# Patient Record
Sex: Male | Born: 1965 | Race: Black or African American | Hispanic: No | Smoking: Never smoker
Health system: Southern US, Community
[De-identification: ages and names within clinical notes are randomized; demographics above are authoritative.]

---
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
## 2009-04-05 ENCOUNTER — Ambulatory Visit: Payer: Self-pay | Admitting: Family Medicine

## 2009-04-05 DIAGNOSIS — A630 Anogenital (venereal) warts: Secondary | ICD-10-CM | POA: Insufficient documentation

## 2009-04-05 DIAGNOSIS — R300 Dysuria: Secondary | ICD-10-CM

## 2009-04-05 DIAGNOSIS — R079 Chest pain, unspecified: Secondary | ICD-10-CM | POA: Insufficient documentation

## 2009-04-10 ENCOUNTER — Encounter: Payer: Self-pay | Admitting: Family Medicine

## 2009-04-10 ENCOUNTER — Ambulatory Visit: Payer: Self-pay | Admitting: Family Medicine

## 2009-04-11 ENCOUNTER — Encounter: Payer: Self-pay | Admitting: Family Medicine

## 2009-04-19 ENCOUNTER — Ambulatory Visit: Payer: Self-pay | Admitting: Family Medicine

## 2009-04-19 LAB — CONVERTED CEMR LAB
CO2: 28 meq/L (ref 19–32)
Calcium: 10 mg/dL (ref 8.4–10.5)
Chloride: 100 meq/L (ref 96–112)
Cholesterol: 214 mg/dL — ABNORMAL HIGH (ref 0–200)
Glucose, Bld: 92 mg/dL (ref 70–99)
HCT: 47.2 % (ref 39.0–52.0)
MCV: 91.5 fL (ref 78.0–100.0)
RBC: 5.16 M/uL (ref 4.22–5.81)
Sodium: 139 meq/L (ref 135–145)
Total Bilirubin: 0.7 mg/dL (ref 0.3–1.2)
Total Protein: 7.4 g/dL (ref 6.0–8.3)
Triglycerides: 99 mg/dL (ref <150)
VLDL: 20 mg/dL (ref 0–40)
WBC: 3.8 10*3/uL — ABNORMAL LOW (ref 4.0–10.5)

## 2009-05-14 ENCOUNTER — Encounter: Payer: Self-pay | Admitting: Family Medicine

## 2009-05-14 ENCOUNTER — Ambulatory Visit: Payer: Self-pay | Admitting: Family Medicine

## 2009-05-14 DIAGNOSIS — R319 Hematuria, unspecified: Secondary | ICD-10-CM | POA: Insufficient documentation

## 2009-05-14 LAB — CONVERTED CEMR LAB
Bilirubin Urine: NEGATIVE
Glucose, Urine, Semiquant: NEGATIVE
Ketones, urine, test strip: NEGATIVE
Protein, U semiquant: NEGATIVE
RBC / HPF: >20
Specific Gravity, Urine: 1.02
WBC Urine, dipstick: NEGATIVE
pH: 6.5

## 2009-05-21 ENCOUNTER — Ambulatory Visit: Payer: Self-pay | Admitting: Family Medicine

## 2010-04-01 NOTE — Assessment & Plan Note (Signed)
Summary: f/up,tcb   Vital Signs:  Patient profile:   45 year old male Weight:      210.4 pounds Pulse rate:   82 / minute BP sitting:   129 / 85  (right arm)  Vitals Entered By: Arlyss Repress CMA, (May 21, 2009 11:01 AM) CC: f/up hematuria. pt did not have CT scan done. decided not to have it. thinks, that he had passed the kidney stone. denies hematuria, dysuria or flank pain. pt here to discuss 'private' issue. Is Patient Diabetic? No Pain Assessment Patient in pain? no        Primary Care Provider:  Paula Compton MD  CC:  f/up hematuria. pt did not have CT scan done. decided not to have it. thinks, that he had passed the kidney stone. denies hematuria, and dysuria or flank pain. pt here to discuss 'private' issue.Marland Kitchen  History of Present Illness: Jeremy Good comes back today to address the perinanal condyloma.  Says the first cycle of the podophylox was intolerable.  Medication extended beyond the area of involvement, made wiping difficult.   Was seen for kidney stone recently, flank pain and gross hematuria resolved at the time of last visit.  No dysuria.   No further gross hematuria.   Current Medications (verified): 1)  Podofilox 0.5 % Soln (Podofilox) .... Sig: Apply To Wart Two Times Daily For Three Consecutive Days, Then Rest For 4 Days. Complete 4 Weeks of Treatment Disp 1 Container  Allergies (verified): No Known Drug Allergies  Physical Exam  General:  Well-developed,well-nourished,in no acute distress; alert,appropriate and cooperative throughout examination Rectal:  Lateral perianal condylomatous lesion.  Mild crusting of peripheral perianal skin.  Good sphincter tone.    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  Inable to tolerate podophyllin.  Opts for weekly to bi-weekly cryo therapy.  First cryo applied during today's visit.   Orders: Cryo (1st lesion) benign -  [facility] (17000) [facility]- Est Level  3 (16109)  Problem # 2:  HEMATURIA UNSPECIFIED (ICD-599.70)  Suspected that he passed a kidney stone (first one ever).  Did not keep CT scan appt.  WIll recheck UA for microscopic hematuria at next visit, for cryo of condylomatous lesion in 1-2 weeks.   Orders: [facility]- Est Level  3 (99213)  Complete Medication List: 1)  Podofilox 0.5 % Soln (Podofilox) .... Sig: apply to wart two times daily for three consecutive days, then rest for 4 days. complete 4 weeks of treatment disp 1 container  Patient Instructions: 1)  It was a pleasure to see you again today.  2)  I ask that you make another appointment with me in 2 weeks for another treatment.  3)  I would like to recheck your urine for the presence of microscopic blood in your urine, which may be related to the presence of kidney stones.

## 2010-04-01 NOTE — Letter (Signed)
Summary: Generic Letter  [facility]  [address]   [place], [place] [address]   Phone: [phone]  Fax: [phone]    04/11/2009  Jeremy Good [address] [place], [place]  [address]  Dear Mr. HAWKES,   It was a pleasure to see you in the office last week. I hope this letter finds you well.  I write with excellent news about your lab results. All your studies came back within the normal range.    A copy of your labs is enclosed with this letter.  Please feel free to call with any questions or concerns.     Sincerely,   Paula Compton MD  Appended Document: Generic Letter mailed.

## 2010-04-01 NOTE — Miscellaneous (Signed)
Summary: walk in  Clinical Lists Changes came in c/o rectal bleeding. denies constipation or hemmorhoids. states he does have warts in the area. played a lot of basketball this weekend. feels faint & dizzy. he thinks this is from his anxiety over seeing blood. placed in [facility] not. aware he will not be seeing pcp.Golden Circle RN  May 14, 2009 3:27 PM

## 2010-04-01 NOTE — Assessment & Plan Note (Signed)
Summary: f/u labs/bmc   Vital Signs:  Patient profile:   45 year old male Height:      70.25 inches Weight:      213 pounds BMI:     30.45 Temp:     97.3 degrees F oral Pulse rate:   85 / minute BP sitting:   130 / 90  (left arm) Cuff size:   large  Vitals Entered By: Tessie Fass CMA (April 19, 2009 3:56 PM) CC: F/U labs Is Patient Diabetic? No Pain Assessment Patient in pain? yes     Location: lower back   CC:  F/U labs.  History of Present Illness: Patient returns to discuss lab results, treatment plan for condylomatous lesion in perianal region.  No change in the perianal lesion.    Discussed results of UA and all bloodwork, including his negative HIV and RPR test.  No new exposures in the past six months to warrant repeat HIV testing.   In addition to the conylomatous lesion, also wtih a patch of skin that is itchy, on Rbuttock. Has been there a long time.   Habits & Providers  Alcohol-Tobacco-Diet     Tobacco Status: never  Current Medications (verified): 1)  Podofilox 0.5 % Soln (Podofilox) .... Sig: Apply To Wart Two Times Daily For Three Consecutive Days, Then Rest For 4 Days. Complete 4 Weeks of Treatment Disp 1 Container  Allergies (verified): No Known Drug Allergies  Physical Exam  General:  well appearing, no apparent distress Rectal:  verrucous lesion at 4 oclock in perianal area.  Anoscopy performed, no other lesions or masses noted.  R buttock skin with patch of rough, atopic-looking skin.  No erythema, no fluctuance.    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  Will try to treat with podophyllin at home, instructions discussed with patient. He agrees to this treatment plan.  Topical steroid to atopic skin on R buttock. Orders: [facility]- Est Level  3 (45409)  Problem # 2:  DYSURIA (ICD-788.1) Negative UA on last visit.  Sxs only after  intercourse.  No known history of STI, but will check urine GC/Chlamydia PCR, patient to bring sample back to lab next week.  Orders: [facility]- Est Level  3 (99213)Future Orders: Miscellaneous Lab Charge-[facility] ([id number]) ... 04/11/2010  Complete Medication List: 1)  Podofilox 0.5 % Soln (Podofilox) .... Sig: apply to wart two times daily for three consecutive days, then rest for 4 days. complete 4 weeks of treatment disp 1 container  Patient Instructions: 1)  The medication I am prescribing is called Podofilox.  Apply to the wart only, two times daily for three straight days,then rest (hold it)for four days.  Do this for a total of 4 cycles (4 weeks).  Avoid getting the medicine on surrounding skin as much as possible.  2)  For the rough patch on the right buttock, I am prescribing a topical steroid cream to use twice daily for the next two weeks. 3)  I would like see you again in about 1 month to see how the treatment has gone.  Prescriptions: PODOFILOX 0.5 % SOLN (PODOFILOX) SIG: Apply to wart two times daily for three consecutive days, then rest for 4 days. Complete 4 weeks of treatment DISP 1 container  #1 x 0   Entered and Authorized by:   Paula Compton MD   Signed by:   Paula Compton MD on 04/19/2009   Method used:  Electronically to        [facility] [phone]* (retail)       [address]       [place], [place]  [address]       Ph: [phone]       Fax: [phone]   RxID:   [id number]

## 2010-04-01 NOTE — Assessment & Plan Note (Signed)
Summary: NP,tcb   Vital Signs:  Patient profile:   45 year old male Height:      70.25 inches Weight:      211.3 pounds BMI:     30.21 Pulse rate:   72 / minute BP sitting:   127 / 80  (left arm)  Vitals Entered By: Arlyss Repress CMA, (April 05, 2009 4:10 PM) CC: new pt. rectum itching and burning sensation all his life.  Is Patient Diabetic? No Pain Assessment Patient in pain? no        CC:  new pt. rectum itching and burning sensation all his life. .  History of Present Illness: This is Jeremy Good first visit to our office. Had not had an established physician ever in his life. His goals are to keep well in order to be there forhis three daughters.   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 Had been seen for this in 1986, given reassurance and had not had it re-evaluated.  Describes it as an itch that is more bothersome at night and after intercourse. No changes in bowelhabits.  Notpainful.  Has noticed a small bump in the perianal skin fora longtime. Has never known himself to have HSV2. See ROS for furtherROS questions.   Also reports somechest pain that occursover the L pectoralis insertion alongthe humerus.  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  Not at all present duringseveral games of basketball.  He rates thepain at about a 2 on a 10-pt scale when it is at its worst.  No diaphoresis, no dyspnea, no radiatiokn of discomfort.   Finally, reports having occasional dysuria after intercourse. Notpresent anyothertime.  Girlfriend had a cystitis recently, wonders if hecould have gottena bladder infection from her. MOst of the time he does not sufferfrom dysuria.   Habits & Providers  Alcohol-Tobacco-Diet     Tobacco Status: never  Family History: No known familyhistory of cancers.  No DM or heart disease.  Paternal uncles and cousins with  hypertension.   Social History: Never a smoker. 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  Drinks one beer and one shot of liquor per day.  Occasionally may drink up to six beers in a sitting, once a month. No illicit drugs. Lives with fiance, three daughters.  Two older children (one son, one daughter) by a prior marriage.  Enjoys basketball, works out at gymSmoking Status:  never  Review of Systems       Weight fluctuates, under his control.  Denies fevers or chills, denies nausea or vomiting. Two BMs a day, soft andformed. No diarrhea.  No changes in bowel habits, no blood per rectum. No cough , no delayed bladder emptying. no nocturia. Pruritus ani as described in HPI.  Physical Exam  General:  well appearing, no apparent distress Eyes:  PERRL, EOMI. Clear conjunctivae Ears:  External ear exam shows no significant lesions or deformities.  Otoscopic examination reveals clear canals, tympanic membranes are intact bilaterally without bulging, retraction, inflammation or discharge. Hearing is grossly normal bilaterally. Mouth:  Oral mucosa and oropharynx without lesions or exudates.  Some missing teeth. Neck:  No deformities, masses, or tenderness noted. Chest Wall:  No pain to palpation. No sternal pain.  No skin lesions. Lungs:  Normal respiratory effort, chest expands symmetrically. Lungs are clear to auscultation, no crackles or wheezes. Heart:  Normal rate and  regular rhythm. S1 and S2 normal without gallop, murmur, click, rub or other extra sounds. Abdomen:  Bowel sounds positive,abdomen soft and non-tender without masses, organomegaly or hernias noted. Rectal:  4mm verrucous lesion along the perianal skin, at 7 o'clock. No apposed lesion. No fissures seen. No hemorrhoids. Genitalia:  Testes bilaterally descended without nodularity, tenderness or masses. No scrotal masses or lesions. No penis lesions or urethral discharge.   Impression & Recommendations:  Problem # 1:  CONDYLOMA ACUMINATA, ANAL  (ICD-078.11) Small condylomatous lesion identified with inspection.  WIll consider anoscopy and cryotherapy at next visit.  Also may consider patient-applied therapy such as podofilox.  To check HIV and RPR at labs.  Future Orders: HIV-[facility] ([id number]) ... 04/11/2010 RPR-[facility] [phone]) ... 04/03/2010  Problem # 2:  CHEST PAIN UNSPECIFIED (ICD-786.50) I suspect chest pain is musculoskeletal in etiology, as it is located along the pec muscle and not associated with exertion.  No radiation.  Pattern of pain with lifting, but not aerobic exercise. Reassurance.  Future Orders: Comp Met-[facility] [id number]) ... 04/10/2010 CBC-[facility] ([id number]) ... 04/03/2010  Problem # 3:  SCREENING FOR LIPOID DISORDERS (ICD-V77.91) Screeningfor hypercholesterolemia, DM, to be donewhen patient can return in fastingstate.  Future Orders: Lipid-[facility] ([id number]) ... 04/03/2010  Problem # 4:  DYSURIA (ICD-788.1) Intermittent. TO check a UA.  May want to consider GC/Clamydia PCR at next visit, which will be to address the condyloma issue. HIV and RPR are ordered wiht labs.  Future Orders: Urinalysis-[facility] (00000) ... 04/11/2010  Patient Instructions: 1)  It was a pleasure to see you in the office today.  2)  I am ordering fasting labwork, to be done after 8 hours of fasting.  3)  Also, I would like to see you back in the office in the coming 1 to 2 weeks to address the issue of anal irritation and itching that we discussed.

## 2010-04-01 NOTE — Assessment & Plan Note (Signed)
Summary: rectal bleeding/Bennett/breen   Vital Signs:  Patient profile:   45 year old male Weight:      215.4 pounds Pulse rate:   76 / minute BP sitting:   130 / 90  (right arm)  Vitals Entered By: Arlyss Repress CMA, (May 14, 2009 3:32 PM) CC: hematuria x  1 and extreme right flank pain with urination. 7/10. Is Patient Diabetic? No Pain Assessment Patient in pain? no        Primary Care Provider:  Paula Compton MD  CC:  hematuria x  1 and extreme right flank pain with urination. 7/10.Marland Kitchen  History of Present Illness: 73 YOM w/ PMHx/o recurrent R flank pain presenting w/ 1 day hx/o hematuria and dysuria. Pt reports having one episode of hematuria of "pink" urine as well as pain w/ urination. Pt statescR flank pain began after playing several games of pickup basketball 2 days prior.Pt states that this has never happened before. Pt states that urine color and dysuria have improved since episode.  Pt denies any recent infections,fevers,abd pain,  genital lesions, or hx/o hematuria in the past, or change in sexual partners. Pt does report significant family hx/o of kidney stones in mother as well as several other family members.Pt denies hx/o urinary frequency, urinary urgency, urinary incontinence.  Pt reports 2-3 year hx/o recurrent R flank pain, however pt denies any hx/o UTIs, or hx/o kidney stones.                 Allergies: No Known Drug Allergies  Physical Exam  General:  alert and well-developed.   Head:  normocephalic and atraumatic.   Eyes:  vision grossly intact.   Mouth:  good dentition, no oral lesions identified.  Neck:  supple and full ROM, no cervical LAD Lungs:  normal respiratory effort.  CTAB Heart:  RRR Abdomen:  S/NT/ND Msk:  No flank pain on palpation of back bilaterally.  Neurologic:  alert & oriented X3 and cranial nerves II-XII intact.   Skin:  turgor normal, color normal, no rashes, and no suspicious lesions.   Psych:  Oriented X3 and memory intact for recent  and remote.     Impression & Recommendations:  Problem # 1:  HEMATURIA UNSPECIFIED (ICD-599.70) Pt w/ likley new onset nephrolithiasis given presentation and strong family hx/o kidney stones. Will also obtain CT to evaluate for stone burden as well other etiologies for pt's hematuria. Plan to followup w/ PCP for results.  Orders: Urinalysis-[facility] (00000) CT with Contrast (CT w/ contrast) [facility]- Est  Level 4 (16109) CT without Contrast (CT w/o contrast)  Complete Medication List: 1)  Podofilox 0.5 % Soln (Podofilox) .... Sig: apply to wart two times daily for three consecutive days, then rest for 4 days. complete 4 weeks of treatment disp 1 container  Laboratory Results   Urine Tests  Date/Time Received: May 14, 2009 3:39 PM  Date/Time Reported: May 14, 2009 3:55 PM   Routine Urinalysis   Color: yellow Appearance: Clear Glucose: negative   (Normal Range: Negative) Bilirubin: negative   (Normal Range: Negative) Ketone: negative   (Normal Range: Negative) Spec. Gravity: 1.020   (Normal Range: 1.003-1.035) Blood: moderate   (Normal Range: Negative) pH: 6.5   (Normal Range: 5.0-8.0) Protein: negative   (Normal Range: Negative) Urobilinogen: 2.0   (Normal Range: 0-1) Nitrite: negative   (Normal Range: Negative) Leukocyte Esterace: negative   (Normal Range: Negative)  Urine Microscopic WBC/HPF: 0-3 RBC/HPF: >20 Bacteria/HPF: trace Mucous/HPF: trace Epithelial/HPF: 0-2  Comments: ...........test performed by...........Marland KitchenTerese Door, CMA

## 2010-04-01 NOTE — Miscellaneous (Signed)
Summary: DNKA cat scan  Clinical Lists Changes [facility] called to report that this pt did not keep his appt for a stone scan.Golden Circle RN  May 21, 2009 12:26 PM

## 2010-04-25 ENCOUNTER — Encounter: Payer: Self-pay | Admitting: *Deleted

## 2011-05-21 ENCOUNTER — Ambulatory Visit (INDEPENDENT_AMBULATORY_CARE_PROVIDER_SITE_OTHER): Payer: BC Managed Care – PPO | Admitting: Family Medicine

## 2011-05-21 VITALS — BP 141/90 | HR 71 | Temp 97.8°F | Ht 71.0 in | Wt 213.4 lb

## 2011-05-21 DIAGNOSIS — M25569 Pain in unspecified knee: Secondary | ICD-10-CM

## 2011-05-21 DIAGNOSIS — M25562 Pain in left knee: Secondary | ICD-10-CM

## 2011-05-21 MED ORDER — IBUPROFEN 800 MG PO TABS: 800.0000 mg | ORAL_TABLET | Freq: Three times a day (TID) | ORAL | Status: AC | PRN

## 2011-05-21 NOTE — Progress Notes (Signed)
Subjective: The patient is a 46 y.o. year old male who presents today for left knee pain/swelling.  Started playing basketball last week and since then has had some problems with knee swelling.  Injured at practice 9 days ago then re-injured it 4 days ago.  Did come down on it wrong one time during the game.  Has been using ice/elevation to some effect.  ROM limited by pain.  Did have arthroscopic surgery in 1981 on left knee.  This was for a medial meniscus injury.  Objective:  Filed Vitals:   05/21/11 0953  BP: 141/90  Pulse: 71  Temp: 97.8 F (36.6 C)   Gen: NAD Ext: Left knee has mild joint effusion with no erythema.  ROM (flexion) slightly limited due to discomfort.  Slight pain on the medial knee with varus stress.  No clicks or catches.  Assessment/Plan: Acute knee injury, 9 days ago with injury 3 days ago.  Will tx with motrin and RICE.  RTC if not better in 7-10 days  Please also see individual problems in problem list for problem-specific plans.

## 2011-05-21 NOTE — Patient Instructions (Signed)
It was great to see you today! I want you to take 800mg  of Motrin three times a day with meals for then next week.  Try to take it a little easy during that time as well to let your knee heal.

## 2012-04-25 ENCOUNTER — Ambulatory Visit (INDEPENDENT_AMBULATORY_CARE_PROVIDER_SITE_OTHER): Payer: BC Managed Care – PPO | Admitting: Family Medicine

## 2012-04-25 ENCOUNTER — Encounter: Payer: Self-pay | Admitting: Family Medicine

## 2012-04-25 ENCOUNTER — Ambulatory Visit (HOSPITAL_COMMUNITY)
Admission: RE | Admit: 2012-04-25 | Discharge: 2012-04-25 | Disposition: A | Discharge status: Home / Self Care | Payer: BC Managed Care – PPO | Source: Ambulatory Visit | Attending: Family Medicine | Admitting: Family Medicine

## 2012-04-25 ENCOUNTER — Other Ambulatory Visit (HOSPITAL_COMMUNITY)
Admission: RE | Admit: 2012-04-25 | Discharge: 2012-04-25 | Disposition: A | Discharge status: Home / Self Care | Payer: BC Managed Care – PPO | Source: Ambulatory Visit | Attending: Family Medicine | Admitting: Family Medicine

## 2012-04-25 VITALS — BP 140/94 | HR 68 | Ht 70.25 in | Wt 213.8 lb

## 2012-04-25 DIAGNOSIS — Z711 Person with feared health complaint in whom no diagnosis is made: Secondary | ICD-10-CM

## 2012-04-25 DIAGNOSIS — Z113 Encounter for screening for infections with a predominantly sexual mode of transmission: Secondary | ICD-10-CM | POA: Insufficient documentation

## 2012-04-25 DIAGNOSIS — M79642 Pain in left hand: Secondary | ICD-10-CM

## 2012-04-25 DIAGNOSIS — M940 Chondrocostal junction syndrome [Tietze]: Secondary | ICD-10-CM

## 2012-04-25 DIAGNOSIS — M79609 Pain in unspecified limb: Secondary | ICD-10-CM

## 2012-04-25 DIAGNOSIS — R300 Dysuria: Secondary | ICD-10-CM

## 2012-04-25 DIAGNOSIS — R079 Chest pain, unspecified: Secondary | ICD-10-CM

## 2012-04-25 DIAGNOSIS — R030 Elevated blood-pressure reading, without diagnosis of hypertension: Secondary | ICD-10-CM

## 2012-04-25 LAB — POCT URINALYSIS DIPSTICK
Blood, UA: NEGATIVE
Ketones, UA: NEGATIVE
Protein, UA: NEGATIVE
Spec Grav, UA: 1.015
Urobilinogen, UA: 0.2
pH, UA: 6

## 2012-04-25 NOTE — Patient Instructions (Signed)
I'll let you know about the tests.   Your EKG looked good.  Make an appointment to be seen by sports medicine for your Left arm.    It was good to meet you today!

## 2012-04-26 ENCOUNTER — Telehealth: Payer: Self-pay | Admitting: Family Medicine

## 2012-04-26 LAB — CBC
Hemoglobin: 14.2 g/dL (ref 13.0–17.0)
MCH: 28.8 pg (ref 26.0–34.0)
MCV: 85.4 fL (ref 78.0–100.0)
Platelets: 211 10*3/uL (ref 150–400)
RBC: 4.93 MIL/uL (ref 4.22–5.81)
WBC: 3.4 10*3/uL — ABNORMAL LOW (ref 4.0–10.5)

## 2012-04-26 LAB — COMPREHENSIVE METABOLIC PANEL
Albumin: 4.2 g/dL (ref 3.5–5.2)
Alkaline Phosphatase: 46 U/L (ref 39–117)
BUN: 11 mg/dL (ref 6–23)
CO2: 27 mEq/L (ref 19–32)
Glucose, Bld: 94 mg/dL (ref 70–99)
Sodium: 137 mEq/L (ref 135–145)
Total Bilirubin: 0.6 mg/dL (ref 0.3–1.2)
Total Protein: 6.9 g/dL (ref 6.0–8.3)

## 2012-04-26 LAB — LDL CHOLESTEROL, DIRECT: Direct LDL: 112 mg/dL — ABNORMAL HIGH

## 2012-04-26 NOTE — Telephone Encounter (Signed)
Pt would like a call concerning sport medicine results.

## 2012-04-26 NOTE — Telephone Encounter (Signed)
Called pt and informed of labs and also that he has appt in [place] on Fri and can discuss labs then. Pt agreed. Lorenda Hatchet, Renato Battles

## 2012-04-27 ENCOUNTER — Encounter: Payer: Self-pay | Admitting: Family Medicine

## 2012-04-27 DIAGNOSIS — Z711 Person with feared health complaint in whom no diagnosis is made: Secondary | ICD-10-CM | POA: Insufficient documentation

## 2012-04-27 DIAGNOSIS — M940 Chondrocostal junction syndrome [Tietze]: Secondary | ICD-10-CM | POA: Insufficient documentation

## 2012-04-27 DIAGNOSIS — M79642 Pain in left hand: Secondary | ICD-10-CM | POA: Insufficient documentation

## 2012-04-27 NOTE — Assessment & Plan Note (Signed)
Tried to reassure patient regarding difference between STD and UTI. Am not sure made much headway regarding this. Obtain GC Chlamydia cytology as well as urinalysis. Will call patient with results.

## 2012-04-27 NOTE — Assessment & Plan Note (Signed)
Noncardiac chest pain. Likely costochondritis. He is an active guy who works out often and I believe his suffering from costochondritis. Did obtain EKG due to patient preference. This was a beautifully normal EKG in normal sinus rhythm. Reassured patient regarding this and he was much relief. He does not like to take medications and will use over-the-counter herbs to try and treat his pain.

## 2012-04-27 NOTE — Progress Notes (Signed)
Jeremy Good is a 47 y.o. male who presents to [facility] today with complaints of several concerns:  1.  Left hand tingling:  Occasionally radiates to Left forearm.  Present for the past 6 weeks roughly. Patient describes tingling during the day and occasional numbness or pain awakened from sleep. Worse thumb index and middle finger. Also has pain at base of left thumb. No pain in left upper arm. Please see below for further details regarding this. No fall or injuries.  2. Chest pain: Also present for past month or so. Patient describes sharp shooting chest pain left side of his chest. Not brought on by exertion. Occasionally brought on by pain in his hand. He is concerned that these are signs of a heart attack with pain in the left side and pain in his left arm. He denies any diaphoresis, nausea, vomiting, shortness of breath. He is active guy and works out daily. He never has pain when working out. Occasionally has pain when pressing upon his chest.  3.  Concern for STD:  Patient states that his current fianc with whom he has been in the past 8 years had trouble with pyelonephritis about 8 years ago. She is not a trouble since then. About 2 weeks ago she had another UTI. He is concerned that he is "passing this back and forth" whenever they have sexual intercourse. He himself occasionally has dysuria. It is not consistent in nature. Denies any hematuria. Denies any history of STDs. Does endorse history of rectal condylomata. About one month ago he does have left lower quadrant pain radiating to his testicles and passage of blood. This lasted for about 3 days and then subsequently subsided. No further pain since then except for occasional dysuria. No further gross hematuria.  Patient also had concerns that his neighbor was "using herbs and roots" to cast spells on him. He believes that his neighbor has been placing magical spells upon the garbage in his yard and that this causes the tingling in his arm whenever  he picks up the trash. He is also afraid these spells are what is causing the pain in his chest. He does endorse a strong religious background as well as belief in witchcraft.  The following portions of the patient's history were reviewed and updated as appropriate: allergies, current medications, past medical history, family and social history, and problem list.  Patient is a nonsmoker.    No past medical history on file. No past surgical history on file.  Medications reviewed. No current outpatient prescriptions on file.   No current facility-administered medications for this visit.    ROS as above otherwise neg.  No chest pain, palpitations, SOB, Fever, Chills, Abd pain, N/V/D.  Physical Exam:  BP 140/94  Pulse 68  Ht 5' 10.25" (1.784 m)  Wt 213 lb 12.8 oz (96.979 kg)  BMI 30.47 kg/m2 Gen:  Alert, cooperative patient who appears stated age in no acute distress.  Vital signs reviewed. HEENT: EOMI,  MMM Pulm:  Clear to auscultation bilaterally with good air movement.  No wheezes or rales noted.   Cardiac:  Regular rate and rhythm without murmur auscultated.  Good S1/S2. Chest: Some tenderness to palpation left-sided chest along left sternal border. Abd:  Soft/nondistended/nontender.  Good bowel sounds throughout all four quadrants.  No masses noted.  GU: Patient deferred genital or rectal exam. Exts: Non edematous BL  LE, warm and well perfused.  Psych: Conversant, pleasant, linear and coherent thought process. Not depressed or anxious appearing.  Results for orders placed in visit on 04/25/12 (from the past 72 hour(s))  POCT URINALYSIS DIPSTICK     Status: None   Collection Time    04/25/12 11:35 AM      Result Value Range   Color, UA YELLOW     Clarity, UA CLEAR     Glucose, UA NEG     Bilirubin, UA NEG     Ketones, UA NEG     Spec Grav, UA 1.015     Blood, UA NEG     pH, UA 6.0     Protein, UA NEG     Urobilinogen, UA 0.2     Nitrite, UA NEG     Leukocytes, UA  Negative    COMPREHENSIVE METABOLIC PANEL     Status: None   Collection Time    04/25/12 12:12 PM      Result Value Range   Sodium 137  135 - 145 mEq/L   Potassium 4.4  3.5 - 5.3 mEq/L   Chloride 102  96 - 112 mEq/L   CO2 27  19 - 32 mEq/L   Glucose, Bld 94  70 - 99 mg/dL   BUN 11  6 - 23 mg/dL   Creat 9.14  7.82 - 9.56 mg/dL   Total Bilirubin 0.6  0.3 - 1.2 mg/dL   Alkaline Phosphatase 46  39 - 117 U/L   AST 16  0 - 37 U/L   ALT 11  0 - 53 U/L   Total Protein 6.9  6.0 - 8.3 g/dL   Albumin 4.2  3.5 - 5.2 g/dL   Calcium 9.9  8.4 - 21.3 mg/dL  CBC     Status: Abnormal   Collection Time    04/25/12 12:12 PM      Result Value Range   WBC 3.4 (*) 4.0 - 10.5 K/uL   RBC 4.93  4.22 - 5.81 MIL/uL   Hemoglobin 14.2  13.0 - 17.0 g/dL   HCT 08.6  57.8 - 46.9 %   MCV 85.4  78.0 - 100.0 fL   MCH 28.8  26.0 - 34.0 pg   MCHC 33.7  30.0 - 36.0 g/dL   RDW 62.9  52.8 - 41.3 %   Platelets 211  150 - 400 K/uL  LDL CHOLESTEROL, DIRECT     Status: Abnormal   Collection Time    04/25/12 12:12 PM      Result Value Range   Direct LDL 112 (*)    Comment: ATP III Classification (LDL):           < 100        mg/dL         Optimal          100 - 129     mg/dL         Near or Above Optimal          130 - 159     mg/dL         Borderline High          160 - 189     mg/dL         High           > 190        mg/dL         Very High

## 2012-04-27 NOTE — Assessment & Plan Note (Signed)
Question of de Quervain's tenosynovitis versus carpal tunnel syndrome. He has symptoms of both of these as well as symptoms of tingling along his median and radial nerves. As these symptoms arise close to his hand rather than anywhere else along his arm, I do not think that he is having trouble with peripheral nerve symptoms located more approximately. Recommended he be seen at [facility] for possible ultrasound and further delineation of his numbness, tingling, pain.

## 2012-04-29 ENCOUNTER — Encounter: Payer: Self-pay | Admitting: Family Medicine

## 2012-04-29 ENCOUNTER — Ambulatory Visit (INDEPENDENT_AMBULATORY_CARE_PROVIDER_SITE_OTHER): Payer: BC Managed Care – PPO | Admitting: Family Medicine

## 2012-04-29 VITALS — BP 143/95 | HR 67 | Ht 70.25 in | Wt 214.0 lb

## 2012-04-29 VITALS — BP 148/92 | HR 81 | Ht 70.25 in | Wt 214.0 lb

## 2012-04-29 DIAGNOSIS — M79609 Pain in unspecified limb: Secondary | ICD-10-CM

## 2012-04-29 DIAGNOSIS — R300 Dysuria: Secondary | ICD-10-CM

## 2012-04-29 DIAGNOSIS — M79642 Pain in left hand: Secondary | ICD-10-CM

## 2012-04-29 NOTE — Assessment & Plan Note (Signed)
Complaint of intermittent dysuria that follows intercourse and first-void in the morning.  No penile discharge.  Reviewed the results of his UA at last visit (pristine) and PCR for GC/CT, which were negative.  Normal renal function on labs.  Discussed other causes other than infectious, such as chemical urethritis, which he believes is not likely.  I have offered to collect/order a urine culture despite the negative UA on last visit; he states he cannot produce a urine specimen today and will defer till next visit.  I have also asked him if he could get some information about the nature of his fiance's recent infection and the antibiotic she was treated with.  He will make a follow up appointment with me for next week to review/discuss.
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
## 2012-04-29 NOTE — Patient Instructions (Addendum)
I think you are suffering from carpal tunnel syndrome. We will start you wearing the brace at night. Let me see you back in 2-3 weeks. I would expect you to still have some numbness then, but hopefully we will be improving.CALL us if it gets suddenly worse or you have questions.

## 2012-04-29 NOTE — Patient Instructions (Addendum)
It was a pleasure to see you today.   It will be very important to see you back with the history of your fiance's recent infection, and to get a urine culture.

## 2012-05-02 ENCOUNTER — Encounter: Payer: Self-pay | Admitting: Family Medicine

## 2012-05-02 NOTE — Progress Notes (Signed)
  Subjective:    Patient ID: Jeremy Good, male    DOB: [birth year], 47 y.o.   MRN: [id number]  HPI 2 weeks of left hand numbness and pain that occurs primarily at night. He has some numbness noted when he falls asleep but easily awakens at 2 or 3 in the morning extreme pain and burning in the left hand. During the day he notes that his hand feels weak particularly in the fingers. He has some difficulty gripping the steering when he drives. He is right-hand dominant. It never had issue with this before. He does lift weights occasionally dark also specific injury.. right hand is asymptomatic. PERTINENT  PMH / PSH: No prior history of left hand or wrist injury and no surgeries.   Review of Systems Denies any color change in the skin of the left hand. Notes no difficulty with cold or heat sensation. No rash. Denies fever, sweats, chills.    Objective:   Physical Exam  Vital signs are reviewed GENERAL: Well-developed male no acute distress AND skull and bilaterally symmetrical. The skin is without any sign of rash or discoloration. The left hand reveals no ecchymoses. There is no thenar or hyperthenar atrophy. The abductor and adduct her portion of the intrinsic muscles is intact. Grip strength is 2+ bilaterally symmetrical. Sensation to soft touch is intact as is 2. discrimination. Positive Tinel on the left. Positive Phalen and 35 seconds.      Assessment & Plan:  #1. Carpal tunnel syndrome. Unclear etiology especially given this is his nondominant hand. We will treat conservatively a cockup splint for 2 weeks and then have him return to clinic. If no improvement I will consider ultrasound plus minus corticosteroid injection at that time.

## 2012-05-03 ENCOUNTER — Ambulatory Visit: Payer: BC Managed Care – PPO | Admitting: Family Medicine

## 2012-05-23 ENCOUNTER — Ambulatory Visit: Payer: BC Managed Care – PPO | Admitting: Family Medicine

## 2013-09-11 ENCOUNTER — Encounter: Payer: Self-pay | Admitting: *Deleted

## 2013-09-11 NOTE — Progress Notes (Signed)
   Pt [facility] around 1 PM today with complaint of dizziness, shortness of breath after activity and feeling faint.  Pt denied chest pain.  Pt also stated he has been coughing up dark yellow mucous and having ear drainage x 3 weeks.  Pt has tried mucinex  with some relief.  Precepted with Dr. Jennette KettleNeal; tried to find an appt slop [facility] at [facility], but no appt available, pt informed.  Pt told he should go to urgent care.  Pt stated he does not want to go to urgent care and opt for an appt tomorrow 09/12/2013.  Pt advised if symptoms worsen he should go to urgent care or emergency room.  Appt 09/12/2013 at 3 PM.  Clovis PuMartin, Tamika L, RN

## 2013-09-12 ENCOUNTER — Ambulatory Visit: Payer: BC Managed Care – PPO | Admitting: Family Medicine

## 2013-09-13 ENCOUNTER — Ambulatory Visit: Payer: BC Managed Care – PPO | Admitting: Family Medicine

## 2013-09-14 ENCOUNTER — Ambulatory Visit (INDEPENDENT_AMBULATORY_CARE_PROVIDER_SITE_OTHER): Payer: BC Managed Care – PPO | Admitting: Family Medicine

## 2013-09-14 ENCOUNTER — Encounter: Payer: Self-pay | Admitting: Family Medicine

## 2013-09-14 VITALS — BP 121/76 | HR 78 | Temp 97.9°F | Ht 70.25 in | Wt 210.3 lb

## 2013-09-14 DIAGNOSIS — R0989 Other specified symptoms and signs involving the circulatory and respiratory systems: Secondary | ICD-10-CM | POA: Insufficient documentation

## 2013-09-14 DIAGNOSIS — R42 Dizziness and giddiness: Secondary | ICD-10-CM | POA: Insufficient documentation

## 2013-09-14 NOTE — Assessment & Plan Note (Signed)
No red flags, most likely multifactorial.  Recommend lots of fluids, mask for work, no more inner ear drops and f/u in 3-4 weeks.  If no improvement then evaluate for BPPV or labyrinthitis.

## 2013-09-14 NOTE — Patient Instructions (Signed)
[name], it was nice seeing you today.  Please stop using the drops in your ears and start using the mucinex DM two times per day.  We will see you back in 3-4 weeks.  Thanks, Dr. Paulina FusiHess

## 2013-09-14 NOTE — Progress Notes (Signed)
Jeremy Good is a 48 y.o. male who presents today for f/u of pre-syncope and chest congestion.  Chest Congestion - Has been ongoing now for about 1 month with nasal congestion, productive cough with yellow sputum, HA and has taken mucinex with some relief.  Denies any CP, blurred vision.  Has had some dizziness, especially with activity over the past week-two weeks   Dizziness - Has been ongoing now for multiple months, has been increasing over the past 2 weeks, when he was [place].  Was out in the heat when he noticed he was slightly dizzy but denies any vertigo or hearing loss.  He does endorse some tinnitus b/l but only happens with dizziness and denies any hearing loss fever chills sweats or recent swimming type activities/otalgia.  He does use hydrogen peroxide in his ears over the last two weeks to try and help.  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  His dizzy spells are fewer than 3 per day and he does not have any syncope, pre-syncope, or palpitations.  No FHx of SCD.     No past medical history on file.  History  Smoking status  . Never Smoker   Smokeless tobacco  . Not on file    No family history on file.  No current outpatient prescriptions on file prior to visit.   No current facility-administered medications on file prior to visit.    ROS: Per HPI.  All other systems reviewed and are negative.   Physical Exam Filed Vitals:   09/14/13 0909  BP: 121/76  Pulse: 78  Temp: 97.9 F (36.6 C)    Physical Examination: General appearance - alert, well appearing, and in no distress Mental status - alert, oriented to person, place, and time Eyes - pupils equal and reactive, extraocular eye movements intact Ears - bilateral TM's and external ear canals normal Nose - normal and patent, no erythema, discharge or polyps Chest - clear to auscultation, no wheezes, rales or rhonchi, symmetric air  entry Heart - normal rate, regular rhythm, normal S1, S2, no murmurs, rubs, clicks or gallops    Chemistry      Component Value Date/Time   NA 137 04/25/2012 1212   K 4.4 04/25/2012 1212   CL 102 04/25/2012 1212   CO2 27 04/25/2012 1212   BUN 11 04/25/2012 1212   CREATININE 1.07 04/25/2012 1212   CREATININE 1.16 04/10/2009 1813      Component Value Date/Time   CALCIUM 9.9 04/25/2012 1212   ALKPHOS 46 04/25/2012 1212   AST 16 04/25/2012 1212   ALT 11 04/25/2012 1212   BILITOT 0.6 04/25/2012 1212

## 2013-09-14 NOTE — Assessment & Plan Note (Signed)
Continue with Mucinex, switch to DM BID.  F/U in 3-4 weeks if no improvement.

## 2013-10-10 ENCOUNTER — Ambulatory Visit: Payer: BC Managed Care – PPO | Admitting: Family Medicine

## 2013-10-17 ENCOUNTER — Encounter: Payer: Self-pay | Admitting: Family Medicine

## 2013-10-17 ENCOUNTER — Ambulatory Visit (INDEPENDENT_AMBULATORY_CARE_PROVIDER_SITE_OTHER): Payer: BC Managed Care – PPO | Admitting: Family Medicine

## 2013-10-17 VITALS — BP 120/70 | HR 81 | Temp 98.2°F | Ht 70.0 in | Wt 209.5 lb

## 2013-10-17 DIAGNOSIS — R0989 Other specified symptoms and signs involving the circulatory and respiratory systems: Secondary | ICD-10-CM | POA: Diagnosis not present

## 2013-10-17 DIAGNOSIS — R42 Dizziness and giddiness: Secondary | ICD-10-CM | POA: Diagnosis not present

## 2013-10-17 DIAGNOSIS — E669 Obesity, unspecified: Secondary | ICD-10-CM | POA: Insufficient documentation

## 2013-10-17 DIAGNOSIS — Z833 Family history of diabetes mellitus: Secondary | ICD-10-CM | POA: Diagnosis not present

## 2013-10-17 NOTE — Patient Instructions (Signed)
It was a pleasure to see you today; I am glad you are feeling better from the respiratory illness standpoint.   As we discussed, I am asking for your signature in order for me to receive the report of the MRI you had ordered through the Urgent Care ([facility]).  I am ordering fasting labs, to be done here at your convenience.   I recommend colorectal cancer screening at age 48.   PATIENT TO SIGN ROI FORM FOR RECORDS/MRI REPORT DONE AT OUTSIDE URGENT CARE RECENTLY.  PATIENT FOR FASTING LABS HERE AT HIS CONVENIENCE.  PLEASE GIVE PATIENT A COPY OF THE CONTACT INFORMATION FOR COLON CANCER SCREENING.

## 2013-10-18 NOTE — Progress Notes (Signed)
   Subjective:    Patient ID: Jeremy Good, male    DOB: 1965-08-03, 48 y.o.   MRN: [id number]  HPI Patient here for follow up of recent upper respiratory illness; had [facility] with cough, dizziness and sensation "like I was going to pass out" while driving to the beach.  These symptoms alarmed him.  He did not have episode of LOC, but did feel dizzy. He sought care at a local urgent care and had an MRI ordered for his symptoms; this MRI was done at [facility].  He did not receive the results of the MRI.  He has a history of HSV2 for many years, wonders if this may have contributed to his illness.  At the present time he reports that he is almost back to 100% and is feeling well.  Denies fevers/chills, cough, shortness of breath, nausea/vomiting, dizziness, presyncope, chest pain, dysuria, penile discharge, active HSV2 lesions, or diarrhea/constipation, no blood per rectum.  He continues to exercise and is serious about maintaining his health.  Reports that he has been impressed with the impact of a product he gets at the health food store ("Para Response"), which contains black walnut hull and other natural supplements.  Continues to take this; has taken [employer] before, but states it is too strong with the Para Response.   He is a non-smoker. Describes his alcohol intake as reduced from a quart/week of liquor to a pint/week, as well as occasional beer.  No other drugs.  Does exercise regularly.  Is in a monogamous relationship with a woman of many years.   Family Hx; No colorectal cancer, no prostate cancer; there is significant DM history (uncles/aunts); father had alcoholism.   Patient is turning 48 later this month, discussion about recommended screening (CRC) and primary prevention (pneumococcal vaccine, influenza vaccine).  Patient admits he is "against vaccines", and has regularly refused flu vaccines.  Extended discussion about how vaccines work and risks/benefits.  Review of  Systems See above    Face-to-face encounter with patient was in excess of 45 minutes in counseling and discussion.   Objective:   Physical Exam Well appearing, no apparent distress, perhaps mildly pressured speech. HEENT Neck supple, no cervical adenopathy. EOMI. Clear oropharynx. TMs clear bilaterally. No tenderness along pinnae tragus.  No frontal or maxillary sinus tenderness.  COR regular S1S2, no extra sounds PULM Clear bilaterally, no rales or wheezes.  ABD Soft, nontender.  NEURO Gait unremarkable, no assistance.        Assessment & Plan:

## 2013-10-18 NOTE — Assessment & Plan Note (Signed)
Resolved; likely viral URI, which is self-limiting.  Discussed healthy eating, exercise, as well as routine vaccinations that may prevent patient from contracting some respiratory illnesses (pneumococcal pneumonia, influenza).  He remains skeptical.  Will be available to answer his questions as needed.

## 2013-10-18 NOTE — Assessment & Plan Note (Signed)
BMI of 30 today; family hx of DM.  Patient for screening for DM, to be done as part of fasting labs.  Also discussed other screening measures and provided him with a copy of contacts where he may make his own appointment for colorectal cancer screening.  This was recommedned to patient on verge of turning 48 years of age.  He is in agreement with this recommendation.

## 2013-10-18 NOTE — Assessment & Plan Note (Signed)
Resolved

## 2013-10-19 ENCOUNTER — Telehealth: Payer: Self-pay | Admitting: Family Medicine

## 2013-10-19 NOTE — Telephone Encounter (Signed)
I called patient to follow up on CT abd/pelvis done at [facility], for which patient left a CD-ROM of images at his last visit this week. He signed a ROI form to request the formal reading from radiologist. I informed him that I will mail the CD-ROM back to him via [employer], he is in agreement with this plan.  I will contact him when his fasting labs are done in our system (ordered as 'Future Orders').  Paula ComptonJames Dorr Perrot, MD

## 2013-10-26 ENCOUNTER — Other Ambulatory Visit: Payer: BC Managed Care – PPO

## 2013-11-03 ENCOUNTER — Ambulatory Visit: Payer: BC Managed Care – PPO | Admitting: Family Medicine

## 2013-12-04 ENCOUNTER — Ambulatory Visit (INDEPENDENT_AMBULATORY_CARE_PROVIDER_SITE_OTHER): Payer: BC Managed Care – PPO | Admitting: Family Medicine

## 2013-12-04 ENCOUNTER — Encounter: Payer: Self-pay | Admitting: Family Medicine

## 2013-12-04 VITALS — BP 141/93 | HR 80 | Temp 98.6°F | Ht 70.0 in | Wt 209.0 lb

## 2013-12-04 DIAGNOSIS — M62830 Muscle spasm of back: Secondary | ICD-10-CM

## 2013-12-04 DIAGNOSIS — M545 Low back pain, unspecified: Secondary | ICD-10-CM

## 2013-12-04 LAB — POCT URINALYSIS DIPSTICK
BILIRUBIN UA: NEGATIVE
Blood, UA: NEGATIVE
GLUCOSE UA: NEGATIVE
KETONES UA: NEGATIVE
Leukocytes, UA: NEGATIVE
Nitrite, UA: NEGATIVE
Protein, UA: NEGATIVE
SPEC GRAV UA: 1.02
Urobilinogen, UA: 0.2
pH, UA: 5.5

## 2013-12-04 MED ORDER — CYCLOBENZAPRINE HCL 5 MG PO TABS: 5.0000 mg | ORAL_TABLET | Freq: Three times a day (TID) | ORAL | Status: DC | PRN

## 2013-12-04 NOTE — Progress Notes (Signed)
Patient ID: Jeremy Good, male   DOB: [birth year], 48 y.o.   MRN: [id number]   Subjective:  Jeremy Good is a 48 y.o. male here for back pain.  Reports 2-3 weeks of low back pain L > R which began after feeling a pulling sensation when cleaning his car out. Pain is intermittent, helped somewhat by ibuprofen and not getting worse or better. He has been drinking a lot of cranberry juice and water in case it was a kidney stone or kidney infection. He has no history of either and denies dysuria, change in urinary habits, abd pain, fever. Denies any current bowel/bladder problems, unintentional weight loss, night time awakenings secondary to pain, and weakness in one or both legs.  All other pertinent systems reviewed and are negative. Objective:  BP 141/93  Pulse 80  Temp(Src) 98.6 F (37 C) (Oral)  Ht 5\' 10"  (1.778 m)  Wt 209 lb (94.802 kg)  BMI 29.99 kg/m2  Gen: Pleasant 48 y.o. male in NAD Abd: Soft, NTND, BS present, no suprapubic tenderness Back:  Normal skin. Spine with normal alignment and no deformity. No tenderness to vertebral process palpation. Paraspinous muscles are tender with spasm on the L > R side at the level of L4-5. Range of motion is full at neck and lumbar sacral regions. Straight leg raise is negative. No CVA tenderness Neuro: Sensation and motor function 5/5 bilateral lower extremities. Patellar and achilles DTR's 2+ Assessment & Plan:  Jeremy Good is a 48 y.o. male with:  Problem List Items Addressed This Visit     Other   Spasm of back muscles - Primary     No red flags. Reviewed stretching and exercises. Warm compresses. Continue ibuprofen and activity as tolerated. Rx short term muscle relaxant.     Relevant Medications      cyclobenzaprine (FLEXERIL) tablet    Other Visit Diagnoses   Bilateral low back pain without sciatica        Relevant Medications       cyclobenzaprine (FLEXERIL) tablet    Other Relevant Orders       POCT urinalysis dipstick (Completed)

## 2013-12-04 NOTE — Patient Instructions (Signed)
You have spasm of your back muscles causing your pain. We need to get these to loosen up. You should still be active but don't lift anything too heavy.  - You should take flexeril to loosen up those muscles and help with your pain.  - You can also continue to taken ibuprofen for the pain.  - The heating pad is your BEST FRIEND - Schedule an appointment with a chiropractor

## 2013-12-05 NOTE — Assessment & Plan Note (Signed)
No red flags. Reviewed stretching and exercises. Warm compresses. Continue ibuprofen and activity as tolerated. Rx short term muscle relaxant.

## 2018-04-04 ENCOUNTER — Other Ambulatory Visit: Payer: Self-pay

## 2018-04-04 ENCOUNTER — Encounter: Payer: Self-pay | Admitting: Family Medicine

## 2018-04-04 ENCOUNTER — Ambulatory Visit: Payer: BC Managed Care – PPO | Admitting: Family Medicine

## 2018-04-04 VITALS — BP 138/86 | HR 80 | Temp 98.4°F | Ht 70.25 in | Wt 202.6 lb

## 2018-04-04 DIAGNOSIS — Z13220 Encounter for screening for lipoid disorders: Secondary | ICD-10-CM

## 2018-04-04 DIAGNOSIS — Z0000 Encounter for general adult medical examination without abnormal findings: Secondary | ICD-10-CM | POA: Diagnosis not present

## 2018-04-04 DIAGNOSIS — Z1159 Encounter for screening for other viral diseases: Secondary | ICD-10-CM | POA: Diagnosis not present

## 2018-04-04 DIAGNOSIS — Z114 Encounter for screening for human immunodeficiency virus [HIV]: Secondary | ICD-10-CM | POA: Diagnosis not present

## 2018-04-04 DIAGNOSIS — Z1211 Encounter for screening for malignant neoplasm of colon: Secondary | ICD-10-CM

## 2018-04-04 NOTE — Progress Notes (Signed)
New Patient Office Visit  Subjective:  Patient ID: Jeremy Good, male    DOB: Jun 23, 1965  Age: 53 y.o. MRN: [id number]  CC:  Chief Complaint  Patient presents with  . Establish Care    HPI Jeremy Good presents for a new patient visit today to reestablish care.  He has previously been seen at the family medicine clinic but is been over 3 years since he was last seen.  He reports that he is generally healthy individual makes an effort to eat healthy and exercise regularly.  He did express concern for a recent injury to his knee which is been improving albeit slowly and recent erectile dysfunction.  History reviewed. No pertinent past medical history.  Past Surgical History:  Procedure Laterality Date  . left knee artrhoscopy  Left 1981    Family History  Problem Relation Age of Onset  . Cancer Mother   . Alcohol abuse Father   . Heart disease Father   . Hypertension Father     Social History   Socioeconomic History  . Marital status: Divorced    Spouse name: Not on file  . Number of children: 5  . Years of education: Not on file  . Highest education level: Not on file  Occupational History  . 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 Financial resource strain: Not on file  . Food insecurity:    Worry: Not on file    Inability: Not on file  . Transportation needs:    Medical: Not on file    Non-medical: Not on file  Tobacco Use  . Smoking status: Never Smoker  . Smokeless tobacco: Never Used  Substance and Sexual Activity  . Alcohol use: Yes    Alcohol/week: 18.0 - 26.0 standard drinks    Types: 18 - 26 Standard drinks or equivalent per week  . Drug use: Not on file  . Sexual activity: Yes    Birth control/protection: Condom  Lifestyle  . Physical activity:    Days per week: Not on file    Minutes per session: Not on file  . Stress: Not on file  Relationships  . Social connections:    Talks on phone: Not on file    Gets  together: Not on file    Attends religious service: Not on file    Active member of club or organization: Not on file    Attends meetings of clubs or organizations: Not on file    Relationship status: Not on file  . Intimate partner violence:    Fear of current or ex partner: Not on file    Emotionally abused: Not on file    Physically abused: Not on file    Forced sexual activity: Not on file  Other Topics Concern  . Not on file  Social History Narrative  . Not on file     Objective:   Today's Vitals: BP 138/86   Pulse 80   Temp 98.4 F (36.9 C) (Oral)   Ht 5' 10.25" (1.784 m)   Wt 202 lb 9.6 oz (91.9 kg)   SpO2 98%   BMI 28.86 kg/m   Physical Exam Constitutional:      Appearance: Normal appearance. He is normal weight.  HENT:     Head: Normocephalic and atraumatic.     Nose: Nose normal.     Mouth/Throat:     Mouth: Mucous membranes are moist.     Pharynx: Oropharynx is clear.  Eyes:     Extraocular Movements: Extraocular movements intact.     Conjunctiva/sclera: Conjunctivae normal.     Pupils: Pupils are equal, round, and reactive to light.  Neck:     Musculoskeletal: Normal range of motion and neck supple.  Cardiovascular:     Rate and Rhythm: Normal rate and regular rhythm.     Pulses: Normal pulses.     Heart sounds: Normal heart sounds.  Pulmonary:     Effort: Pulmonary effort is normal.     Breath sounds: Normal breath sounds.  Abdominal:     General: Abdomen is flat. Bowel sounds are normal.     Palpations: Abdomen is soft.  Musculoskeletal:     Comments: Antalgic gait secondary to right knee pain.  Skin:    General: Skin is warm and dry.  Neurological:     General: No focal deficit present.     Mental Status: He is alert. Mental status is at baseline.  Psychiatric:        Mood and Affect: Mood normal.        Behavior: Behavior normal.     Assessment & Plan:  Jeremy Good is a well-appearing 53 year old man who presented to clinic today to  reestablish care.  Immunizations: He was not interested in the influenza vaccine although was interested in the tetanus vaccine and would like time to consider it.  Will readdress at follow-up appointment on 2/13.  Cancer screening: Referral placed for colonoscopy for routine cancer screening.  Hepatitis C screening: Hepatitis C ordered will obtain blood sample at follow-up lab visit on 2/4.  Lipid screen: Lipid panel ordered will obtain blood sample on follow-up lab visit on 2/4.  Kidney/liver function: We will assess CMP.  Order placed, will obtain blood sample on follow-up laboratory visit on 2/4.  STI screening, no current symptoms: HIV ordered will obtain blood sample at lab visit on 2/4.  Right knee pain: Follow-up visit scheduled for 2/13 to address knee pain.  Erectile dysfunction: Follow-up visit scheduled for 2/13 to address erectile dysfunction.   Follow-up: Return if symptoms worsen or fail to improve.   Mirian MoPeter Skippy Marhefka, MD

## 2018-04-04 NOTE — Patient Instructions (Signed)
It was great to meet you today.  I am glad we had a chance to get a good medical history.  I think it would be a great idea to see you again soon so we can talk about your knee and the ED that you mentioned.  At that next visit, we can get the bloodwork that we mentioned today. (It has already been ordered).  I'm sorry for the inconvenience. You'll be contacted for the colonoscopy.

## 2018-04-05 ENCOUNTER — Other Ambulatory Visit: Payer: BC Managed Care – PPO

## 2018-04-05 DIAGNOSIS — Z13220 Encounter for screening for lipoid disorders: Secondary | ICD-10-CM

## 2018-04-05 DIAGNOSIS — Z114 Encounter for screening for human immunodeficiency virus [HIV]: Secondary | ICD-10-CM

## 2018-04-05 DIAGNOSIS — Z1159 Encounter for screening for other viral diseases: Secondary | ICD-10-CM

## 2018-04-05 NOTE — Addendum Note (Signed)
Addended by: Manson Passey, CARINA on: 04/05/2018 08:43 AM   Modules accepted: Level of Service

## 2018-04-06 LAB — COMPREHENSIVE METABOLIC PANEL
ALT: 14 IU/L (ref 0–44)
AST: 20 IU/L (ref 0–40)
Albumin/Globulin Ratio: 2 (ref 1.2–2.2)
Albumin: 4.5 g/dL (ref 3.8–4.9)
Alkaline Phosphatase: 54 IU/L (ref 39–117)
BUN/Creatinine Ratio: 14 (ref 9–20)
BUN: 14 mg/dL (ref 6–24)
Bilirubin Total: 0.4 mg/dL (ref 0.0–1.2)
CALCIUM: 9.8 mg/dL (ref 8.7–10.2)
CO2: 26 mmol/L (ref 20–29)
Chloride: 103 mmol/L (ref 96–106)
Creatinine, Ser: 0.99 mg/dL (ref 0.76–1.27)
GFR calc Af Amer: 99 mL/min/{1.73_m2} (ref >59)
GFR calc non Af Amer: 86 mL/min/{1.73_m2} (ref >59)
Globulin, Total: 2.3 g/dL (ref 1.5–4.5)
Glucose: 82 mg/dL (ref 65–99)
Potassium: 5.3 mmol/L — ABNORMAL HIGH (ref 3.5–5.2)
Sodium: 144 mmol/L (ref 134–144)
Total Protein: 6.8 g/dL (ref 6.0–8.5)

## 2018-04-06 LAB — HCV COMMENT:

## 2018-04-06 LAB — HEPATITIS C ANTIBODY (REFLEX)

## 2018-04-06 LAB — HIV ANTIBODY (ROUTINE TESTING W REFLEX): HIV Screen 4th Generation wRfx: NONREACTIVE

## 2018-04-06 LAB — LIPID PANEL
Chol/HDL Ratio: 2.3 ratio (ref 0.0–5.0)
Cholesterol, Total: 209 mg/dL — ABNORMAL HIGH (ref 100–199)
HDL: 92 mg/dL (ref >39)
LDL Calculated: 94 mg/dL (ref 0–99)
Triglycerides: 116 mg/dL (ref 0–149)
VLDL Cholesterol Cal: 23 mg/dL (ref 5–40)

## 2018-04-14 ENCOUNTER — Ambulatory Visit: Payer: BC Managed Care – PPO | Admitting: Family Medicine

## 2018-07-05 ENCOUNTER — Telehealth: Payer: Self-pay | Admitting: *Deleted

## 2018-07-05 NOTE — Telephone Encounter (Signed)
Attempted to call patient but call couldn't be completed at this time.  Will continue to try and reach him, but will also mail a letter to ask him to schedule a follow up to discuss additional concerns from his February appt.  Jazmin Hartsell,CMA

## 2020-01-04 ENCOUNTER — Encounter (HOSPITAL_BASED_OUTPATIENT_CLINIC_OR_DEPARTMENT_OTHER): Payer: Self-pay

## 2020-01-04 ENCOUNTER — Emergency Department (HOSPITAL_BASED_OUTPATIENT_CLINIC_OR_DEPARTMENT_OTHER): Payer: BC Managed Care – PPO

## 2020-01-04 ENCOUNTER — Other Ambulatory Visit: Payer: Self-pay

## 2020-01-04 ENCOUNTER — Emergency Department (HOSPITAL_BASED_OUTPATIENT_CLINIC_OR_DEPARTMENT_OTHER)
Admission: EM | Admit: 2020-01-04 | Discharge: 2020-01-04 | Disposition: A | Discharge status: Home / Self Care | Payer: BC Managed Care – PPO | Attending: Emergency Medicine | Admitting: Emergency Medicine

## 2020-01-04 DIAGNOSIS — Y999 Unspecified external cause status: Secondary | ICD-10-CM | POA: Diagnosis not present

## 2020-01-04 DIAGNOSIS — I10 Essential (primary) hypertension: Secondary | ICD-10-CM | POA: Diagnosis not present

## 2020-01-04 DIAGNOSIS — Y92410 Unspecified street and highway as the place of occurrence of the external cause: Secondary | ICD-10-CM | POA: Diagnosis not present

## 2020-01-04 DIAGNOSIS — M5441 Lumbago with sciatica, right side: Secondary | ICD-10-CM | POA: Diagnosis not present

## 2020-01-04 DIAGNOSIS — Y9389 Activity, other specified: Secondary | ICD-10-CM | POA: Insufficient documentation

## 2020-01-04 DIAGNOSIS — M545 Low back pain, unspecified: Secondary | ICD-10-CM | POA: Diagnosis present

## 2020-01-04 MED ORDER — METHOCARBAMOL 500 MG PO TABS: 500.0000 mg | ORAL_TABLET | Freq: Three times a day (TID) | ORAL | 0 refills | Status: DC | PRN

## 2020-01-04 MED ORDER — METHOCARBAMOL 500 MG PO TABS
750.0000 mg | ORAL_TABLET | Freq: Once | ORAL | Status: AC
  Administered 2020-01-04: 750 mg via ORAL
  Filled 2020-01-04: qty 2
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
## 2020-04-15 ENCOUNTER — Ambulatory Visit (INDEPENDENT_AMBULATORY_CARE_PROVIDER_SITE_OTHER): Payer: BC Managed Care – PPO | Admitting: Family Medicine

## 2020-04-15 ENCOUNTER — Encounter: Payer: Self-pay | Admitting: Family Medicine

## 2020-04-15 ENCOUNTER — Other Ambulatory Visit: Payer: Self-pay

## 2020-04-15 VITALS — BP 139/89 | Ht 71.0 in | Wt 206.6 lb

## 2020-04-15 DIAGNOSIS — Z1211 Encounter for screening for malignant neoplasm of colon: Secondary | ICD-10-CM | POA: Insufficient documentation

## 2020-04-15 DIAGNOSIS — R070 Pain in throat: Secondary | ICD-10-CM

## 2020-04-15 DIAGNOSIS — Z13220 Encounter for screening for lipoid disorders: Secondary | ICD-10-CM | POA: Diagnosis not present

## 2020-04-15 DIAGNOSIS — Z131 Encounter for screening for diabetes mellitus: Secondary | ICD-10-CM | POA: Insufficient documentation

## 2020-04-15 MED ORDER — FLUTICASONE PROPIONATE 50 MCG/ACT NA SUSP: 1.0000 | Freq: Every day | NASAL | 1 refills | Status: DC

## 2020-04-15 NOTE — Assessment & Plan Note (Signed)
He is most interested in the Cologuard test. -Cologuard test ordered -Message sent to [employer]

## 2020-04-15 NOTE — Progress Notes (Signed)
    SUBJECTIVE:   CHIEF COMPLAINT / HPI:   Screening for colon cancer [name] presents to clinic today in an effort to remain healthy and active and have all appropriate screening performed.  Is particularly interested in screening for colon cancer.  He would like to avoid sedation and for that reason does not want a colonoscopy.  He is particularly interested in send out stool testing.  Throat clearing Mr. [name] describes a discomfort in his throat at night.  He often finds himself clearing his throat or coughing at night while lying down.  This issue has been going on for years and he would like to know if there is any clear cause and what can be done to help treat the symptoms.  He notes that he does have mild seasonal allergies.  He does not take any allergy medication.  He also experiences occasional symptoms of heartburn for which she takes over-the-counter medication without issue.  Family history of diabetes He is interested in screening today.  PERTINENT  PMH / PSH: Non-smoker  OBJECTIVE:   BP 139/89   Ht 5\' 11"  (1.803 m)   Wt 206 lb 9.6 oz (93.7 kg)   BMI 28.81 kg/m   General: Alert and cooperative and appears to be in no acute distress.  Able to stand up comfortably and walk around the exam room and step up onto the exam table. HEENT: No cervical lymphadenopathy.  Mild erythema of the oropharynx without evidence of tonsillar enlargement.  Poor dentition. Cardio: Normal S1 and S2, no S3 or S4. Rhythm is regular. No murmurs or rubs.   Pulm: Clear to auscultation bilaterally, no crackles, wheezing, or diminished breath sounds. Normal respiratory effort Abdomen: Bowel sounds normal. Abdomen soft and non-tender.  Extremities: No peripheral edema. Warm/ well perfused.  Strong radial pulses. Neuro: Cranial nerves grossly intact  ASSESSMENT/PLAN:   Screening for diabetes mellitus -Follow-up A1c  Screening for colon cancer He is most interested in the Cologuard  test. -Cologuard test ordered -Message sent to [facility]  Screening for hyperlipidemia -Follow-up lipid panel  Throat discomfort The most likely causes of this discomfort are postnasal drip and GERD.  For now, we will begin treatment with a trial of Flonase for 1 month.  He was encouraged to return to clinic if this does not improve his symptoms.  He does return to clinic, treatment for GERD would be the next appropriate step.     [facility], MD [facility]

## 2020-04-15 NOTE — Assessment & Plan Note (Signed)
The most likely causes of this discomfort are postnasal drip and GERD.  For now, we will begin treatment with a trial of Flonase for 1 month.  He was encouraged to return to clinic if this does not improve his symptoms.  He does return to clinic, treatment for GERD would be the next appropriate step.

## 2020-04-15 NOTE — Assessment & Plan Note (Signed)
Follow-up lipid panel 

## 2020-04-15 NOTE — Patient Instructions (Addendum)
It was great to see you today.  Here is a quick review of the things we talked about:   Screening for colon cancer:  I have placed a referral for you to get cologuard. you should get a call in the next 1-2 weeks.  Please let me know if you have not received a call in the next 2 weeks.  Post nasal drip:  I suspect that the sensation you are talking about is post nasal drip.  I would like to try treating with nasal spray for 1 month.  Come back in to clinic if it doesn't seem to be getting any better.  We will get some labs today to look for elevated cholesterol and screening for diabetes.   If all of your labs are normal, I will send you a message over my chart or send you a letter.  If there is anything to discuss, I will give you a phone call.

## 2020-04-15 NOTE — Assessment & Plan Note (Addendum)
Follow-up A1c

## 2020-04-16 LAB — LIPID PANEL
Chol/HDL Ratio: 3 ratio (ref 0.0–5.0)
Cholesterol, Total: 250 mg/dL — ABNORMAL HIGH (ref 100–199)
HDL: 82 mg/dL (ref >39)
LDL Chol Calc (NIH): 147 mg/dL — ABNORMAL HIGH (ref 0–99)
Triglycerides: 122 mg/dL (ref 0–149)
VLDL Cholesterol Cal: 21 mg/dL (ref 5–40)

## 2020-04-16 LAB — HEMOGLOBIN A1C
Est. average glucose Bld gHb Est-mCnc: 91 mg/dL
Hgb A1c MFr Bld: 4.8 % (ref 4.8–5.6)

## 2020-04-28 ENCOUNTER — Telehealth: Payer: Self-pay | Admitting: Family Medicine

## 2020-04-28 NOTE — Telephone Encounter (Signed)
The 10-year ASCVD risk score [name], et al., 2013) is: 7.4%   Values used to calculate the score:     Age: 55 years     Sex: Male     Is Non-Hispanic African American: Yes     Diabetic: No     Tobacco smoker: No     Systolic Blood Pressure: 139 mmHg     Is BP treated: No     HDL Cholesterol: 82 mg/dL     Total Cholesterol: 250 mg/dL   Jeremy Good was called and informed of his elevated cholesterol and ASCVD 10-year risk of 7.4%.  He was informed that he does have some risk of heart attack or stroke in the next 10 years and that we typically discuss the risk/benefit of starting medication at this time.  He notes that he does not have hypertension or diabetes and does not have a significant history of coronary artery disease (his father died of heart failure and he had another relative with a hole in her heart).  Together, we decided to move forward with lifestyle modification with exercise and appropriate nutrition and to repeat a lipid panel every 2-3 years.  Over the phone, he noted that he does have some concerns about persistent symptoms he has been experiencing since he had Covid.  He was encouraged to come in to be seen in clinic.  We scheduled an appointment for him at the end of March.  Mirian Mo, MD

## 2020-05-28 ENCOUNTER — Other Ambulatory Visit: Payer: Self-pay

## 2020-05-28 ENCOUNTER — Encounter: Payer: Self-pay | Admitting: Family Medicine

## 2020-05-28 ENCOUNTER — Ambulatory Visit: Payer: BC Managed Care – PPO | Admitting: Family Medicine

## 2020-05-28 VITALS — BP 135/60 | HR 75 | Ht 71.0 in | Wt 207.6 lb

## 2020-05-28 DIAGNOSIS — R0789 Other chest pain: Secondary | ICD-10-CM

## 2020-05-28 DIAGNOSIS — R0982 Postnasal drip: Secondary | ICD-10-CM

## 2020-05-28 DIAGNOSIS — R070 Pain in throat: Secondary | ICD-10-CM

## 2020-05-28 DIAGNOSIS — R079 Chest pain, unspecified: Secondary | ICD-10-CM | POA: Diagnosis not present

## 2020-05-28 NOTE — Patient Instructions (Signed)
Post-nasal drip and Mucous: Begin Flonase today and use this every day. At our next appointment we'll discuss how helpful this proves to be for you.  Heart concerns: EKG today to evaluate your heart Your physical exam today was reassuring, as was your description of your chest discomfort and how it presents  Cologuard: We have spoken with our lab technician about this and he is working to figure out what happened to the Cologuard test. We will follow up with you about this. Thank you for bringing this to our attention.

## 2020-05-28 NOTE — Assessment & Plan Note (Addendum)
Exam and pt's description of occasional chest discomfort is reassuring. Described as "feels like gas is moving." BP in clinic tody was 135/60. Possibly GERD. Pt would like to try using Flonase for the next month before considering using GERD medications.  Plan: EKG machine is down today and we will reschedule the pt for an EKG at his next appointment, which the pt requests If post-nasal drip improves and chest discomfort is still present at next appointment, will consider GERD as possible etiology.

## 2020-05-28 NOTE — Assessment & Plan Note (Deleted)
Pt endorses ample mucous production, worse at night. He picked up Flonase a month ago but has not started using it yet.  Plan: Begin Flonase and use daily for the next month or until our next appointment. At our next appointment we will evaluate how helpful this was for your discomfort.

## 2020-05-28 NOTE — Progress Notes (Signed)
SUBJECTIVE:   CHIEF COMPLAINT / HPI:   Jeremy Good presents today for follow up on his throat and chest discomfort.  Post-nasal drip Jeremy Good notes that he has been having lots of mucous in the interim. He was prescribed Flonase at his last appointment in February, bought it, but has not yet used it.   Chest discomfort Jeremy Good describes an occasional chest discomfort that has been present for several years. In the last few weeks it has been more noticeable for him and describes it as "feels like gas is moving." He notes a sensation of needing to burp, without actually burping. He denies overt reflux. He denies any pressure, tightness, tearing or ripping sensations. His discomfort never presents at rest, but rather presents predictably when he is "over-stressed, has little sleep, and physically over-exerts".  The pt's father had CHF and the patient notes that both his mucous and chest discomfort worries him about having CHF.  PERTINENT  PMH / PSH:  Throat discomfort  OBJECTIVE:   BP 135/60   Pulse 75   Ht 5\' 11"  (1.803 m)   Wt 207 lb 9.6 oz (94.2 kg)   SpO2 99%   BMI 28.95 kg/m   Physical Exam Constitutional:      General: He is not in acute distress.    Appearance: Normal appearance. He is normal weight. He is not ill-appearing, toxic-appearing or diaphoretic.  HENT:     Head: Normocephalic and atraumatic.     Mouth/Throat:     Mouth: Mucous membranes are moist.  Cardiovascular:     Rate and Rhythm: Normal rate and regular rhythm.     Pulses: Normal pulses.     Heart sounds: Normal heart sounds.  Pulmonary:     Effort: Pulmonary effort is normal.     Breath sounds: Normal breath sounds.  Skin:    General: Skin is warm and dry.  Neurological:     General: No focal deficit present.     Mental Status: He is alert and oriented to person, place, and time.  Psychiatric:        Mood and Affect: Mood normal.        Behavior: Behavior normal.      ASSESSMENT/PLAN:    Chest discomfort Exam and pt's description of occasional chest discomfort is reassuring. Described as "feels like gas is moving." BP in clinic tody was 135/60. Possibly GERD. Pt would like to try using Flonase for the next month before considering using GERD medications.  Plan: EKG machine is down today and we will reschedule the pt for an EKG at his next appointment, which the pt requests If post-nasal drip improves and chest discomfort is still present at next appointment, will consider GERD as possible etiology.  Post-nasal drip Pt endorses ample mucous production, worse at night. He picked up Flonase a month ago but has not started using it yet. Feel that post-nasal drip and mucous production are  Plan: Begin Flonase and use daily for the next month or until our next appointment. At our next appointment we will evaluate how helpful this was for your discomfort.     , Medical Student [facility]     I was present for the physical exam and medical decision making for the above encounter.  I agree with the above noted documentation with the following additions:  Nasal congestion and throat clearing Most likely postnasal drip.  He did not take any of the Flonase  that he was prescribed to last visit.  He was encouraged to use the Flonase for the next month and return to clinic and we can assess how your symptoms are doing at that time.  If his symptoms do not improve with Flonase, this may be related to a GERD picture and we can try acid suppression. -Flonase for 1 month  Chest discomfort No red flags.  Not associated with exertion.  I suspect this is either related to GERD or anxiety.  He was offered an EKG today, primarily for reassurance.  The EKG machine was not functional today but he would like to return at a later date to have an EKG done. -Return in 1 month for an EKG.

## 2020-05-28 NOTE — Assessment & Plan Note (Signed)
Pt endorses ample mucous production, worse at night. He picked up Flonase a month ago but has not started using it yet. Feel that post-nasal drip and mucous production are  Plan: Begin Flonase and use daily for the next month or until our next appointment. At our next appointment we will evaluate how helpful this was for your discomfort.

## 2020-06-26 ENCOUNTER — Ambulatory Visit: Payer: BC Managed Care – PPO | Admitting: Family Medicine

## 2021-03-07 ENCOUNTER — Ambulatory Visit: Payer: BC Managed Care – PPO | Admitting: Family Medicine

## 2021-03-20 ENCOUNTER — Other Ambulatory Visit: Payer: Self-pay

## 2021-03-20 ENCOUNTER — Ambulatory Visit (INDEPENDENT_AMBULATORY_CARE_PROVIDER_SITE_OTHER): Payer: BC Managed Care – PPO | Admitting: Student

## 2021-03-20 ENCOUNTER — Encounter: Payer: Self-pay | Admitting: Student

## 2021-03-20 VITALS — BP 142/80 | HR 81 | Ht 71.0 in | Wt 208.0 lb

## 2021-03-20 DIAGNOSIS — R030 Elevated blood-pressure reading, without diagnosis of hypertension: Secondary | ICD-10-CM | POA: Diagnosis not present

## 2021-03-20 DIAGNOSIS — Z1211 Encounter for screening for malignant neoplasm of colon: Secondary | ICD-10-CM

## 2021-03-20 DIAGNOSIS — H938X1 Other specified disorders of right ear: Secondary | ICD-10-CM | POA: Diagnosis not present

## 2021-03-20 DIAGNOSIS — M5442 Lumbago with sciatica, left side: Secondary | ICD-10-CM

## 2021-03-20 NOTE — Assessment & Plan Note (Signed)
142/80 in office today.  Advised patient to take blood pressure readings at home and record. -Follow-up in 1 month

## 2021-03-20 NOTE — Assessment & Plan Note (Signed)
Patient says that he has been having pain that radiates down his left leg.  Straight leg test positive.  No red flag symptoms.  Likely secondary to sciatica pain. -Encouraged back exercises -Physical therapy referral placed

## 2021-03-20 NOTE — Assessment & Plan Note (Signed)
Patient has been having discomfort in the right ear at times.  Had been having some dizziness that has resolved.  Feels that his is overall getting better and feels that it had started her a flight during Thanksgiving time last year.  Can consider eustachian tube dysfunction like patient's ear is not able to "pop" like he could before.  Other differentials include tumor that could be impacting his auditory nerve/eustachian tube. -Advised Afrin only as needed to help -Advised follow-up of this issue if it is not resolving or worsening and at that time could consider ENT referral.

## 2021-03-20 NOTE — Patient Instructions (Addendum)
It was great to see you! Thank you for allowing me to participate in your care!   I recommend that you always bring your medications to each appointment as this makes it easy to ensure we are on the correct medications and helps [place] not miss when refills are needed.  Our plans for today:  -I have put in an order for Cologuard testing, I still recommend colonoscopy as a first-line screening for colon cancer if you are ever interested in a referral -Since you are ear fullness is getting better and I do not see any abnormalities on my examination we can continue to watch it, you can try Afrin only as needed to help however if it is getting worse please let me know and we can consider referring you to ENT -Please check your blood pressures at home and record this for our next visit in a month -I have placed a referral for your back pain and I encourage you to take up aerobic exercises for this pain as well -Stop at the front desk to schedule follow-up in 1 month  Take care and seek immediate care sooner if you develop any concerns. Please remember to show up 15 minutes before your scheduled appointment time!  Levin Erp, MD [facility]

## 2021-03-20 NOTE — Assessment & Plan Note (Signed)
Patient currently not interested in colonoscopy, discussed in length during this visit about the benefits of colonoscopy.  He is amenable to Cologuard. -Cologuard ordered
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
## 2021-04-10 NOTE — Therapy (Incomplete)
OUTPATIENT PHYSICAL THERAPY THORACOLUMBAR EVALUATION   Patient Name: Jeremy Good MRN: [id number] DOB:06-Dec-1965, 56 y.o., male Today's Date: 04/10/2021    No past medical history on file. 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  ONSET DATE: ***  SUBJECTIVE:                                                                                                                                                                                           SUBJECTIVE STATEMENT: *** PERTINENT HISTORY:  ***  PAIN:  Are you having pain? {yes/no:20286} NPRS scale: ***/10 Pain location: *** Pain orientation: {Pain Orientation:25161}  PAIN TYPE: {type:313116} Pain description: {PAIN DESCRIPTION:21022940}  Aggravating factors: *** Relieving factors: ***  PRECAUTIONS: {Therapy precautions:24002}  WEIGHT BEARING RESTRICTIONS {Yes ***/No:24003}  FALLS:  Has patient fallen in last 6 months? {yes/no:20286}, Number of falls: ***  LIVING ENVIRONMENT: Lives with: {[facility] lives with:25569::"lives with their family"} Lives in: {Lives in:25570} Stairs: {yes/no:20286}; {Stairs:24000} Has following equipment at home: {Assistive devices:23999}  OCCUPATION: ***  PLOF:  {PLOF:24004}  PATIENT GOALS ***   OBJECTIVE:   DIAGNOSTIC FINDINGS:  ***  PATIENT SURVEYS:  {rehab surveys:24030}  SCREENING FOR RED FLAGS: Bowel or bladder incontinence: {Yes/No:304960894} Cauda equina syndrome: {Yes/No:304960894}  COGNITION:  Overall cognitive status: {cognition:24006}     SENSATION:  Light touch: {intact/deficits:24005}  MUSCLE LENGTH: Hamstrings: Right *** deg; Left *** deg Thomas test: Right *** deg; Left *** deg  POSTURE:  ***  PALPATION: ***  LUMBAR AROM  AROM AROM  04/10/2021  Flexion   Extension   Right lateral flexion   Left lateral flexion   Right rotation   Left rotation    (Blank rows = not tested)   LE MMT:  MMT Right 04/10/2021 Left 04/10/2021  Hip flexion    Hip extension    Hip abduction     (Blank rows = not tested)  LUMBAR SPECIAL TESTS:  {lumbar special test:25242}  FUNCTIONAL TESTS:  {Functional tests:24029}  GAIT: Distance walked: *** Assistive device utilized: {Assistive devices:23999} Level of assistance: {Levels of assistance:24026} Comments: ***    TODAY'S TREATMENT  [facility] Adult PT Treatment:                                                DATE: 04/11/2021 Therapeutic Exercise: *** Manual Therapy: *** Neuromuscular re-ed: *** Therapeutic Activity: *** Modalities: *** Self Care: ***    PATIENT EDUCATION:  Education details: *** Person educated: {Person educated:25204} Education method: {Education Method:25205} Education comprehension: {Education Comprehension:25206}   HOME EXERCISE PROGRAM: ***  ASSESSMENT:  CLINICAL IMPRESSION: Patient is a *** y.o. *** who was seen today for physical therapy evaluation and treatment for ***. Objective impairments include {opptimpairments:25111}. These impairments are limiting patient from {activity limitations:25113}. Personal factors including {Personal factors:25162} are also affecting patient's functional outcome. Patient will benefit from skilled  PT to address above impairments and improve overall function.  REHAB POTENTIAL: {rehabpotential:25112}  CLINICAL DECISION MAKING: {clinical decision making:25114}  EVALUATION COMPLEXITY: {Evaluation complexity:25115}   GOALS: Goals reviewed with patient? {yes/no:20286}  SHORT TERM GOALS:  STG Name Target Date Goal status  1 *** Baseline:  {follow up:25551} {GOALSTATUS:25110}  2 *** Baseline:  {follow up:25551} {GOALSTATUS:25110}  3 *** Baseline: {follow up:25551} {GOALSTATUS:25110}  4 *** Baseline: {follow up:25551} {GOALSTATUS:25110}  5 *** Baseline: {follow up:25551} {GOALSTATUS:25110}  6 *** Baseline: {follow up:25551} {GOALSTATUS:25110}  7 *** Baseline: {follow up:25551} {GOALSTATUS:25110}   LONG TERM GOALS:   LTG Name Target Date Goal status  1 *** Baseline: {follow up:25551} {GOALSTATUS:25110}  2 *** Baseline: {follow up:25551} {GOALSTATUS:25110}  3 *** Baseline: {follow up:25551} {GOALSTATUS:25110}  4 *** Baseline: {follow up:25551} {GOALSTATUS:25110}  5 *** Baseline: {follow up:25551} {GOALSTATUS:25110}  6 *** Baseline: {follow up:25551} {GOALSTATUS:25110}  7 *** Baseline: {follow up:25551} {GOALSTATUS:25110}   PLAN: PT FREQUENCY: {rehab frequency:25116}  PT DURATION: {rehab duration:25117}  PLANNED INTERVENTIONS: {rehab planned interventions:25118::"Therapeutic exercises","Therapeutic activity","Neuro Muscular re-education","Balance training","Gait training","Patient/Family education","Joint mobilization"}  PLAN FOR NEXT SESSION: Carmelina Dane, PT, DPT 04/10/21 3:29 PM

## 2021-04-11 ENCOUNTER — Ambulatory Visit: Payer: BC Managed Care – PPO | Attending: Family Medicine

## 2021-04-11 ENCOUNTER — Other Ambulatory Visit: Payer: Self-pay

## 2021-04-11 DIAGNOSIS — M5442 Lumbago with sciatica, left side: Secondary | ICD-10-CM | POA: Insufficient documentation

## 2021-04-11 DIAGNOSIS — M6281 Muscle weakness (generalized): Secondary | ICD-10-CM | POA: Insufficient documentation

## 2021-04-11 DIAGNOSIS — G8929 Other chronic pain: Secondary | ICD-10-CM | POA: Insufficient documentation
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
## 2021-04-15 NOTE — Therapy (Signed)
OUTPATIENT PHYSICAL THERAPY TREATMENT NOTE   Patient Name: Jeremy Good MRN: [id number] DOB:November 18, 1965, 56 y.o., male Today's Date: 04/16/2021  PCP: Levin Erp, MD REFERRING PROVIDER: Carney Living, *   PT End of Session - 04/16/21 1829     Visit Number 2    Number of Visits 9    Date for PT Re-Evaluation 06/06/21    Authorization Type BCBS    Authorization Time Period FOTO v6, v10    PT Start Time 1830    PT Stop Time 1910    PT Time Calculation (min) 40 min    Activity Tolerance Patient tolerated treatment well    Behavior During Therapy [facility] for tasks assessed/performed             History reviewed. No pertinent past medical history. Past Surgical History:  Procedure Laterality Date   left knee artrhoscopy  Left 1981   Patient Active Problem List   Diagnosis Date Noted   Acute left-sided low back pain with left-sided sciatica 03/20/2021   Abnormal sensation in right ear 03/20/2021   Elevated blood pressure reading 03/20/2021   Chest discomfort 05/28/2020   Post-nasal drip 05/28/2020   Screening for diabetes mellitus 04/15/2020   Screening for colon cancer 04/15/2020   Screening for hyperlipidemia 04/15/2020   Throat discomfort 04/15/2020   CONDYLOMA ACUMINATA, ANAL 04/05/2009    REFERRING DIAG: M54.42 (ICD-10-CM) - Acute left-sided low back pain with left-sided sciatica  THERAPY DIAG:  Chronic left-sided low back pain with left-sided sciatica  Muscle weakness (generalized)   SUBJECTIVE: Pt reports no pain today, adding that his HEP has been helpful in decreasing his pain.  PAIN:  Are you having pain? No NPRS scale: 0/10 Pain location: low back     OBJECTIVE:    DIAGNOSTIC FINDINGS:  01/04/2020: DG Lumbar Spine Complete: IMPRESSION: No acute osseous abnormality.   PATIENT SURVEYS:  FOTO 53%, projected 66% in 10 visits   SCREENING FOR RED FLAGS: Bowel or bladder incontinence: No Cauda equina syndrome: No   COGNITION:           Overall cognitive status: Within functional limits for tasks assessed                        SENSATION:          Light touch: Deficits 25% diminished sensation on Lt vs Rt from L2-S2   MUSCLE LENGTH: Maisie Fus test: Severe limitation BIL   POSTURE:  Decreased lumbar lordosis in standing/ sitting   PALPATION: TTP to Lt lumbar paraspinals/ QL   PASSIVE ACCESSORIES: Painful and hypomobile, along with reproduction of Lt LE pain/ tingling with CPAs to L2-L4   LUMBAR AROM   AROM AROM  04/11/2021  Flexion 75% with pain  Extension WNL, "feels good"  Right lateral flexion Fingertips to knee, tightness  Left lateral flexion Fingertips to knee, tightness  Right rotation WNL  Left rotation WNL   (Blank rows = not tested)     LE MMT:   MMT Right 04/11/2021 Left 04/11/2021  Hip flexion 5/5 5/5  Hip extension 3+/5 3+/5  Hip abduction 3+/5 3/5   (Blank rows = not tested)   LUMBAR SPECIAL TESTS:  Slump: (+) on Lt SLR: (+) on Lt Repeated trunk extension x20: (+) for centralization     FUNCTIONAL TESTS:  Plank test: 70 seconds Squat: WNL       TODAY'S TREATMENT   [facility] Adult PT Treatment:  DATE: 04/16/2021 Therapeutic Exercise: Dead bugs 3x20 Dead lift with 95# barbell 3x8 Standing cybex hip abduction 2x10 BIL with 32.5# Standing cybex hip extension 2x10 BIL with 32.5# Pallof press with 10# cable 2x10 with 5-sec holds Standing trunk side bend with 30# cable 2x10 BIL Standing repeated trunk extension 2x20 Standing trunk rotation/extension combined stretch 2x10 BIL Manual Therapy: N/A Neuromuscular re-ed: N/A Therapeutic Activity: N/A Modalities: N/A Self Care: N/A       PATIENT EDUCATION:  Education details: Pt educated on potential underlying pathophysiology, prognosis, POC, FOTO, and HEP Person educated: Patient Education method: Programmer, multimedia, Demonstration, and Handouts Education comprehension: verbalized  understanding and returned demonstration     HOME EXERCISE PROGRAM: Access Code: [email or web address] URL: [email or web address] Date: 04/11/2021 Prepared by: Carmelina Dane   Exercises Prone Press Up - 1 x daily - 7 x weekly - 3 sets - 30-sec hold Standing Lumbar Extension - 1 x daily - 7 x weekly - 3 sets - 10 reps Plank on Knees - 1 x daily - 7 x weekly - 3 sets - 10 reps Modified Side Plank with Hip Abduction - 1 x daily - 7 x weekly - 2 sets - 10 reps Supine Bridge - 1 x daily - 7 x weekly - 3 sets - 10 reps Hip Flexor Stretch at [employer] of Bed - 1 x daily - 7 x weekly - 2 sets - 1-min hold     ASSESSMENT:   CLINICAL IMPRESSION: Pt responded well to all interventions today, demonstrating good form and no pain with all initial exercises. He reports moderate fatigue at the end of the session and was instructed to monitor soreness the next few days. He will continue to benefit from skilled PT to address his primary impairments and return to his prior level of function with less limitation.   REHAB POTENTIAL: Excellent   CLINICAL DECISION MAKING: Stable/uncomplicated   EVALUATION COMPLEXITY: Low     GOALS: Goals reviewed with patient? Yes   SHORT TERM GOALS:   STG Name Target Date Goal status  1 Pt will report understanding and adherence to his HEP in order to promote independence in the management of his primary impairments. Baseline: HEP provided at eval 05/09/2021 INITIAL    LONG TERM GOALS:    LTG Name Target Date Goal status  1 Pt will achieve a FOTO score of 66% in order to demonstrate improved functional ability as it relates to his lumbar pain. Baseline: 53% 06/06/2021 INITIAL  2 Pt will achieve BIL global hip strength of 4+/5 or greater in order to promote participation in basketball with less limitation. Baseline: See MMT chart 06/06/2021 INITIAL  3 Pt will report no sleep disturbances due to pain for 1 week in order to be well-rested for work. Baseline: Regular  sleep disturbances >2-3x per week 06/06/2021 INITIAL  4 Pt will achieve 100% lumbar flexion AROM with 0-2/10 pain in order to put on his shoes with less limitation. Baseline:75% with 4/10 pain 06/06/2021 INITIAL  5 Pt will report ability to sit >1 hour with 0-2/10 pain in order to sit down to watch TV with less limitation. Baseline: >4/10 pain after 30 minutes of sitting 06/06/2021 INITIAL    PLAN: PT FREQUENCY: 1x/week   PT DURATION: 8 weeks   PLANNED INTERVENTIONS: Therapeutic exercises, Therapeutic activity, Neuro Muscular re-education, Patient/Family education, Joint mobilization, Dry Needling, Spinal mobilization, Cryotherapy, Moist heat, Taping, Traction, and Manual therapy   PLAN FOR NEXT SESSION: Progress core/ hip strengthening with trunk extension bias  Carmelina Dane, PT, DPT 04/16/21 7:09 PM

## 2021-04-16 ENCOUNTER — Ambulatory Visit: Payer: BC Managed Care – PPO

## 2021-04-16 ENCOUNTER — Other Ambulatory Visit: Payer: Self-pay

## 2021-04-16 DIAGNOSIS — M5442 Lumbago with sciatica, left side: Secondary | ICD-10-CM | POA: Diagnosis not present

## 2021-04-16 DIAGNOSIS — G8929 Other chronic pain: Secondary | ICD-10-CM

## 2021-04-16 DIAGNOSIS — M6281 Muscle weakness (generalized): Secondary | ICD-10-CM

## 2021-04-22 ENCOUNTER — Other Ambulatory Visit: Payer: Self-pay

## 2021-04-22 ENCOUNTER — Ambulatory Visit: Payer: BC Managed Care – PPO

## 2021-04-22 DIAGNOSIS — M6281 Muscle weakness (generalized): Secondary | ICD-10-CM

## 2021-04-22 DIAGNOSIS — M5442 Lumbago with sciatica, left side: Secondary | ICD-10-CM

## 2021-04-22 DIAGNOSIS — G8929 Other chronic pain: Secondary | ICD-10-CM

## 2021-04-22 NOTE — Progress Notes (Incomplete)
    SUBJECTIVE:   CHIEF COMPLAINT / HPI:   ***  PERTINENT  PMH / PSH: ***  OBJECTIVE:   There were no vitals taken for this visit.  ***  ASSESSMENT/PLAN:   No problem-specific Assessment & Plan notes found for this encounter.     Eleora Sutherland, MD [facility]  

## 2021-04-22 NOTE — Therapy (Signed)
OUTPATIENT PHYSICAL THERAPY TREATMENT NOTE   Patient Name: Anubis Fundora MRN: [id number] DOB:01-22-1966, 56 y.o., male Today's Date: 04/22/2021  PCP: Levin Erp, MD REFERRING PROVIDER: Carney Living,   PT End of Session - 04/22/21 1615     Visit Number 3    Number of Visits 9    Date for PT Re-Evaluation 06/06/21    Authorization Type BCBS    Authorization Time Period FOTO v6, v10    PT Start Time 1615    PT Stop Time 1658    PT Time Calculation (min) 43 min    Activity Tolerance Patient tolerated treatment well    Behavior During Therapy WFL for tasks assessed/performed              History reviewed. No pertinent past medical history. Past Surgical History:  Procedure Laterality Date   left knee artrhoscopy  Left 1981   Patient Active Problem List   Diagnosis Date Noted   Acute left-sided low back pain with left-sided sciatica 03/20/2021   Abnormal sensation in right ear 03/20/2021   Elevated blood pressure reading 03/20/2021   Chest discomfort 05/28/2020   Post-nasal drip 05/28/2020   Screening for diabetes mellitus 04/15/2020   Screening for colon cancer 04/15/2020   Screening for hyperlipidemia 04/15/2020   Throat discomfort 04/15/2020   CONDYLOMA ACUMINATA, ANAL 04/05/2009    REFERRING DIAG: M54.42 (ICD-10-CM) - Acute left-sided low back pain with left-sided sciatica  THERAPY DIAG:  Chronic left-sided low back pain with left-sided sciatica  Muscle weakness (generalized)   SUBJECTIVE: My pain is doing better and I've bene doing the exercises and they've been helping a lot.  PAIN:  Are you having pain? Yes NPRS scale: 1/10 Pain location: low back    OBJECTIVE:    DIAGNOSTIC FINDINGS:  01/04/2020: DG Lumbar Spine Complete: IMPRESSION: No acute osseous abnormality.   PATIENT SURVEYS:  FOTO 53%, projected 66% in 10 visits   SCREENING FOR RED FLAGS: Bowel or bladder incontinence: No Cauda equina syndrome: No   COGNITION:           Overall cognitive status: Within functional limits for tasks assessed                        SENSATION:          Light touch: Deficits 25% diminished sensation on Lt vs Rt from L2-S2   MUSCLE LENGTH: Maisie Fus test: Severe limitation BIL   POSTURE:  Decreased lumbar lordosis in standing/ sitting   PALPATION: TTP to Lt lumbar paraspinals/ QL   PASSIVE ACCESSORIES: Painful and hypomobile, along with reproduction of Lt LE pain/ tingling with CPAs to L2-L4   LUMBAR AROM   AROM AROM  04/11/2021  Flexion 75% with pain  Extension WNL, "feels good"  Right lateral flexion Fingertips to knee, tightness  Left lateral flexion Fingertips to knee, tightness  Right rotation WNL  Left rotation WNL   (Blank rows = not tested)     LE MMT:   MMT Right 04/11/2021 Left 04/11/2021  Hip flexion 5/5 5/5  Hip extension 3+/5 3+/5  Hip abduction 3+/5 3/5   (Blank rows = not tested)   LUMBAR SPECIAL TESTS:  Slump: (+) on Lt SLR: (+) on Lt Repeated trunk extension x20: (+) for centralization     FUNCTIONAL TESTS:  Plank test: 70 seconds Squat: WNL       TODAY'S TREATMENT  [facility] Adult PT Treatment:  DATE: 04/22/2021 Therapeutic Exercise: Dead bugs 3x20 Bird dogs 1 x 10, x 10 with dowel on back  Dead lift with 95# barbell 3x8 Standing cybex hip abduction 2x10 BIL with 32.5# Standing cybex hip extension 2x10 BIL with 32.5# Pallof press with 10# cable 2x10 with 5-sec holds Standing repeated trunk extension 2x20   [facility] Adult PT Treatment:                                                DATE: 05/10/2021 Therapeutic Exercise: Dead bugs 3x20 Dead lift with 95# barbell 3x8 Standing cybex hip abduction 2x10 BIL with 32.5# Standing cybex hip extension 2x10 BIL with 32.5# Pallof press with 10# cable 2x10 with 5-sec holds Standing trunk side bend with 30# cable 2x10 BIL Standing repeated trunk extension 2x20 Standing trunk rotation/extension  combined stretch 2x10 BIL Manual Therapy: N/A Neuromuscular re-ed: N/A Therapeutic Activity: N/A Modalities: N/A Self Care: N/A       PATIENT EDUCATION:  Education details: Pt educated on potential underlying pathophysiology, prognosis, POC, FOTO, and HEP Person educated: Patient Education method: Programmer, multimedia, Demonstration, and Handouts Education comprehension: verbalized understanding and returned demonstration     HOME EXERCISE PROGRAM: Access Code: [email or web address] URL: [email or web address] Date: 04/11/2021 Prepared by: Carmelina Dane   Exercises Prone Press Up - 1 x daily - 7 x weekly - 3 sets - 30-sec hold Standing Lumbar Extension - 1 x daily - 7 x weekly - 3 sets - 10 reps Plank on Knees - 1 x daily - 7 x weekly - 3 sets - 10 reps Modified Side Plank with Hip Abduction - 1 x daily - 7 x weekly - 2 sets - 10 reps Supine Bridge - 1 x daily - 7 x weekly - 3 sets - 10 reps Hip Flexor Stretch at [employer] of Bed - 1 x daily - 7 x weekly - 2 sets - 1-min hold     ASSESSMENT:   CLINICAL IMPRESSION: Patient is responding well to therapy with decreased overall pain levels and improved level of function. He was able to complete all exercises with good form and no adverse effects. Focus of session on strengthening core, lower back, and bilateral LE. Patient continues to benefit from skilled PT services and should be progressed as able to improve functional independence.     REHAB POTENTIAL: Excellent   CLINICAL DECISION MAKING: Stable/uncomplicated   EVALUATION COMPLEXITY: Low     GOALS: Goals reviewed with patient? Yes   SHORT TERM GOALS:   STG Name Target Date Goal status  1 Pt will report understanding and adherence to his HEP in order to promote independence in the management of his primary impairments. Baseline: HEP provided at eval 05/09/2021 INITIAL    LONG TERM GOALS:    LTG Name Target Date Goal status  1 Pt will achieve a FOTO score of 66% in  order to demonstrate improved functional ability as it relates to his lumbar pain. Baseline: 53% 06/06/2021 INITIAL  2 Pt will achieve BIL global hip strength of 4+/5 or greater in order to promote participation in basketball with less limitation. Baseline: See MMT chart 06/06/2021 INITIAL  3 Pt will report no sleep disturbances due to pain for 1 week in order to be well-rested for work. Baseline: Regular sleep disturbances >2-3x per week 06/06/2021 Progressing  4 Pt will achieve 100% lumbar flexion AROM  with 0-2/10 pain in order to put on his shoes with less limitation. Baseline:75% with 4/10 pain 06/06/2021 INITIAL  5 Pt will report ability to sit >1 hour with 0-2/10 pain in order to sit down to watch TV with less limitation. Baseline: >4/10 pain after 30 minutes of sitting 06/06/2021 INITIAL    PLAN: PT FREQUENCY: 1x/week   PT DURATION: 8 weeks   PLANNED INTERVENTIONS: Therapeutic exercises, Therapeutic activity, Neuro Muscular re-education, Patient/Family education, Joint mobilization, Dry Needling, Spinal mobilization, Cryotherapy, Moist heat, Taping, Traction, and Manual therapy   PLAN FOR NEXT SESSION: Progress core/ hip strengthening with trunk extension bias   Harland German, PTA 04/22/21 4:58 PM

## 2021-04-23 ENCOUNTER — Ambulatory Visit: Payer: BC Managed Care – PPO | Admitting: Student

## 2021-04-26 NOTE — Therapy (Incomplete)
OUTPATIENT PHYSICAL THERAPY TREATMENT NOTE   Patient Name: Jeremy Good MRN: [id number] DOB:03-Jun-1965, 56 y.o., male Today's Date: 04/26/2021  PCP: Levin Erp, MD REFERRING PROVIDER: Carney Living,      No past medical history on file. Past Surgical History:  Procedure Laterality Date   left knee artrhoscopy  Left 1981   Patient Active Problem List   Diagnosis Date Noted   Acute left-sided low back pain with left-sided sciatica 03/20/2021   Abnormal sensation in right ear 03/20/2021   Elevated blood pressure reading 03/20/2021   Chest discomfort 05/28/2020   Post-nasal drip 05/28/2020   Screening for diabetes mellitus 04/15/2020   Screening for colon cancer 04/15/2020   Screening for hyperlipidemia 04/15/2020   Throat discomfort 04/15/2020   CONDYLOMA ACUMINATA, ANAL 04/05/2009    REFERRING DIAG: M54.42 (ICD-10-CM) - Acute left-sided low back pain with left-sided sciatica  THERAPY DIAG:  No diagnosis found.   SUBJECTIVE: ***  PAIN:  Are you having pain? Yes NPRS scale: 1/10 Pain location: low back    OBJECTIVE:    DIAGNOSTIC FINDINGS:  01/04/2020: DG Lumbar Spine Complete: IMPRESSION: No acute osseous abnormality.   PATIENT SURVEYS:  FOTO 53%, projected 66% in 10 visits   SCREENING FOR RED FLAGS: Bowel or bladder incontinence: No Cauda equina syndrome: No   COGNITION:          Overall cognitive status: Within functional limits for tasks assessed                        SENSATION:          Light touch: Deficits 25% diminished sensation on Lt vs Rt from L2-S2   MUSCLE LENGTH: Maisie Fus test: Severe limitation BIL   POSTURE:  Decreased lumbar lordosis in standing/ sitting   PALPATION: TTP to Lt lumbar paraspinals/ QL   PASSIVE ACCESSORIES: Painful and hypomobile, along with reproduction of Lt LE pain/ tingling with CPAs to L2-L4   LUMBAR AROM   AROM AROM  04/11/2021  Flexion 75% with pain  Extension WNL, "feels good"  Right  lateral flexion Fingertips to knee, tightness  Left lateral flexion Fingertips to knee, tightness  Right rotation WNL  Left rotation WNL   (Blank rows = not tested)     LE MMT:   MMT Right 04/11/2021 Left 04/11/2021  Hip flexion 5/5 5/5  Hip extension 3+/5 3+/5  Hip abduction 3+/5 3/5   (Blank rows = not tested)   LUMBAR SPECIAL TESTS:  Slump: (+) on Lt SLR: (+) on Lt Repeated trunk extension x20: (+) for centralization     FUNCTIONAL TESTS:  Plank test: 70 seconds Squat: WNL       TODAY'S TREATMENT   [facility] Adult PT Treatment:                                                DATE: 04/29/2021 Therapeutic Exercise: *** Manual Therapy: *** Neuromuscular re-ed: *** Therapeutic Activity: *** Modalities: *** Self Care: [facility] Adult PT Treatment:                                                DATE: 04-25-21 Therapeutic Exercise: Dead bugs 3x20 Bird dogs 1  x 10, x 10 with dowel on back  Dead lift with 95# barbell 3x8 Standing cybex hip abduction 2x10 BIL with 32.5# Standing cybex hip extension 2x10 BIL with 32.5# Pallof press with 10# cable 2x10 with 5-sec holds Standing repeated trunk extension 2x20   [facility] Adult PT Treatment:                                                DATE: April 18, 2021 Therapeutic Exercise: Dead bugs 3x20 Dead lift with 95# barbell 3x8 Standing cybex hip abduction 2x10 BIL with 32.5# Standing cybex hip extension 2x10 BIL with 32.5# Pallof press with 10# cable 2x10 with 5-sec holds Standing trunk side bend with 30# cable 2x10 BIL Standing repeated trunk extension 2x20 Standing trunk rotation/extension combined stretch 2x10 BIL Manual Therapy: N/A Neuromuscular re-ed: N/A Therapeutic Activity: N/A Modalities: N/A Self Care: N/A       PATIENT EDUCATION:  Education details: Pt educated on potential underlying pathophysiology, prognosis, POC, FOTO, and HEP Person educated: Patient Education method: Programmer, multimedia, Demonstration,  and Handouts Education comprehension: verbalized understanding and returned demonstration     HOME EXERCISE PROGRAM: Access Code: [email or web address] URL: [email or web address] Date: 04/11/2021 Prepared by: Carmelina Dane   Exercises Prone Press Up - 1 x daily - 7 x weekly - 3 sets - 30-sec hold Standing Lumbar Extension - 1 x daily - 7 x weekly - 3 sets - 10 reps Plank on Knees - 1 x daily - 7 x weekly - 3 sets - 10 reps Modified Side Plank with Hip Abduction - 1 x daily - 7 x weekly - 2 sets - 10 reps Supine Bridge - 1 x daily - 7 x weekly - 3 sets - 10 reps Hip Flexor Stretch at [employer] of Bed - 1 x daily - 7 x weekly - 2 sets - 1-min hold     ASSESSMENT:   CLINICAL IMPRESSION: ***     REHAB POTENTIAL: Excellent   CLINICAL DECISION MAKING: Stable/uncomplicated   EVALUATION COMPLEXITY: Low     GOALS: Goals reviewed with patient? Yes   SHORT TERM GOALS:   STG Name Target Date Goal status  1 Pt will report understanding and adherence to his HEP in order to promote independence in the management of his primary impairments. Baseline: HEP provided at eval 05/09/2021 INITIAL    LONG TERM GOALS:    LTG Name Target Date Goal status  1 Pt will achieve a FOTO score of 66% in order to demonstrate improved functional ability as it relates to his lumbar pain. Baseline: 53% 06/06/2021 INITIAL  2 Pt will achieve BIL global hip strength of 4+/5 or greater in order to promote participation in basketball with less limitation. Baseline: See MMT chart 06/06/2021 INITIAL  3 Pt will report no sleep disturbances due to pain for 1 week in order to be well-rested for work. Baseline: Regular sleep disturbances >2-3x per week 06/06/2021 Progressing  4 Pt will achieve 100% lumbar flexion AROM with 0-2/10 pain in order to put on his shoes with less limitation. Baseline:75% with 4/10 pain 06/06/2021 INITIAL  5 Pt will report ability to sit >1 hour with 0-2/10 pain in order to sit down to watch TV  with less limitation. Baseline: >4/10 pain after 30 minutes of sitting 06/06/2021 INITIAL    PLAN: PT FREQUENCY: 1x/week   PT DURATION: 8 weeks  PLANNED INTERVENTIONS: Therapeutic exercises, Therapeutic activity, Neuro Muscular re-education, Patient/Family education, Joint mobilization, Dry Needling, Spinal mobilization, Cryotherapy, Moist heat, Taping, Traction, and Manual therapy   PLAN FOR NEXT SESSION: Progress core/ hip strengthening with trunk extension bias   Carmelina Dane, PT, DPT 04/26/21 10:50 AM

## 2021-04-29 ENCOUNTER — Ambulatory Visit: Payer: BC Managed Care – PPO

## 2021-05-06 ENCOUNTER — Other Ambulatory Visit: Payer: Self-pay

## 2021-05-06 ENCOUNTER — Ambulatory Visit: Payer: BC Managed Care – PPO | Attending: Family Medicine

## 2021-05-06 DIAGNOSIS — M5442 Lumbago with sciatica, left side: Secondary | ICD-10-CM | POA: Insufficient documentation

## 2021-05-06 DIAGNOSIS — M6281 Muscle weakness (generalized): Secondary | ICD-10-CM | POA: Insufficient documentation

## 2021-05-06 DIAGNOSIS — G8929 Other chronic pain: Secondary | ICD-10-CM | POA: Insufficient documentation

## 2021-05-06 NOTE — Therapy (Signed)
?OUTPATIENT PHYSICAL THERAPY TREATMENT NOTE ? ? ?Patient Name: Jeremy Good ?MRN: [id number] ?DOB:[birth year], 56 y.o., male ?Today's Date: 05/06/2021 ? ?PCP: Gerrit Heck, MD ?REFERRING PROVIDER: Lind Covert, ? ? PT End of Session - 05/06/21 1400   ? ? Visit Number 4   ? Number of Visits 9   ? Date for PT Re-Evaluation 06/06/21   ? Authorization Type BCBS   ? Authorization Time Period FOTO v6, v10   ? PT Start Time 1400   ? PT Stop Time 1430   Pt reports he has to leave early today for a car appointment.  ? PT Time Calculation (min) 30 min   ? Activity Tolerance Patient tolerated treatment well   ? Behavior During Therapy [facility] for tasks assessed/performed   ? ?  ?  ? ?  ? ? ? ? ?History reviewed. No pertinent past medical history. ?Past Surgical History:  ?Procedure Laterality Date  ? left knee artrhoscopy  Left 1981  ? ?Patient Active Problem List  ? Diagnosis Date Noted  ? Acute left-sided low back pain with left-sided sciatica 03/20/2021  ? Abnormal sensation in right ear 03/20/2021  ? Elevated blood pressure reading 03/20/2021  ? Chest discomfort 05/28/2020  ? Post-nasal drip 05/28/2020  ? Screening for diabetes mellitus 04/15/2020  ? Screening for colon cancer 04/15/2020  ? Screening for hyperlipidemia 04/15/2020  ? Throat discomfort 04/15/2020  ? 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 ? ?REFERRING DIAG: M54.42 (ICD-10-CM) - Acute left-sided low back pain with left-sided sciatica ? ?THERAPY DIAG:  ?Chronic left-sided low back pain with left-sided sciatica ? ?Muscle weakness (generalized) ? ? ?SUBJECTIVE: Pt reports no LBP today, adding that he has been doing his HEP daily. He reports he has to leave early today to get to a car appointment. ? ?PAIN:  ?Are you having pain? Yes ?NPRS scale: 0/10 ?Pain location: low back ? ? ? ?OBJECTIVE:  ?  ?DIAGNOSTIC FINDINGS:  ?01/04/2020: DG Lumbar Spine Complete: IMPRESSION: ?No acute osseous abnormality. ?  ?PATIENT SURVEYS:  ?FOTO 53%, projected 66% in 10 visits ?   ?SCREENING FOR RED FLAGS: ?Bowel or bladder incontinence: No ?Cauda equina syndrome: No ?  ?COGNITION: ?         Overall cognitive status: Within functional limits for tasks assessed              ?          ?SENSATION: ?         Light touch: Deficits 25% diminished sensation on Lt vs Rt from L2-S2 ?  ?MUSCLE LENGTH: ?Thomas test: Severe limitation BIL ?  ?POSTURE:  ?Decreased lumbar lordosis in standing/ sitting ?  ?PALPATION: ?TTP to Lt lumbar paraspinals/ QL ?  ?PASSIVE ACCESSORIES: ?Painful and hypomobile, along with reproduction of Lt LE pain/ tingling with CPAs to L2-L4 ?  ?LUMBAR AROM ?  ?AROM AROM  ?04/11/2021  ?Flexion 75% with pain  ?Extension WNL, "feels good"  ?Right lateral flexion Fingertips to knee, tightness  ?Left lateral flexion Fingertips to knee, tightness  ?Right rotation WNL  ?Left rotation WNL  ? (Blank rows = not tested) ?  ?  ?LE MMT: ?  ?MMT Right ?04/11/2021 Left ?04/11/2021  ?Hip flexion 5/5 5/5  ?Hip extension 3+/5 3+/5  ?Hip abduction 3+/5 3/5  ? (Blank rows = not tested) ?  ?LUMBAR SPECIAL TESTS:  ?Slump: (+) on Lt ?SLR: (+) on Lt ?Repeated trunk extension x20: (+) for centralization ?  ?  ?FUNCTIONAL TESTS:  ?Plank test:  70 seconds ?Squat: WNL ?  ?  ?  ?TODAY'S TREATMENT  ? ?[facility] Adult PT Treatment:                                                DATE: 05/16/2021 ?Therapeutic Exercise: ?Dead bugs with YTB around feet 3x20 ?Bird dogs knee taps 3x10 BIL ?Sidelying lumbar open books 2x10 BIL ?Prone with hips on Bosu ball and PT holding down legs; trunk extension 2x10 ?Sidelying with hips on Bosu ball and PT holding down legs; side trunk bend 2x10 BIL ?Plank with alternating hip extension 3x10 ?Manual Therapy: ?N/A ?Neuromuscular re-ed: ?N/A ?Therapeutic Activity: ?N/A ?Modalities: ?N/A ?Self Care: ?N/A ? ? ?[facility] Adult PT Treatment:                                                DATE: 2021-05-02 ?Therapeutic Exercise: ?Dead bugs 3x20 ?Bird dogs 1 x 10, x 10 with dowel on back  ?Dead lift with  95# barbell 3x8 ?Standing cybex hip abduction 2x10 BIL with 32.5# ?Standing cybex hip extension 2x10 BIL with 32.5# ?Pallof press with 10# cable 2x10 with 5-sec holds ?Standing repeated trunk extension 2x20 ? ? ?[facility] Adult PT Treatment:                                                DATE: 2021/04/26 ?Therapeutic Exercise: ?Dead bugs 3x20 ?Dead lift with 95# barbell 3x8 ?Standing cybex hip abduction 2x10 BIL with 32.5# ?Standing cybex hip extension 2x10 BIL with 32.5# ?Pallof press with 10# cable 2x10 with 5-sec holds ?Standing trunk side bend with 30# cable 2x10 BIL ?Standing repeated trunk extension 2x20 ?Standing trunk rotation/extension combined stretch 2x10 BIL ?Manual Therapy: ?N/A ?Neuromuscular re-ed: ?N/A ?Therapeutic Activity: ?N/A ?Modalities: ?N/A ?Self Care: ?N/A ?  ?  ?  ?PATIENT EDUCATION:  ?Education details: Pt educated on potential underlying pathophysiology, prognosis, POC, FOTO, and HEP ?Person educated: Patient ?Education method: Explanation, Demonstration, and Handouts ?Education comprehension: verbalized understanding and returned demonstration ?  ?  ?HOME EXERCISE PROGRAM: ?Access Code: [email or web address] ?URL: [email or web address] ?Date: 04/11/2021 ?Prepared by: Vanessa [place] ?  ?Exercises ?Prone Press Up - 1 x daily - 7 x weekly - 3 sets - 30-sec hold ?Standing Lumbar Extension - 1 x daily - 7 x weekly - 3 sets - 10 reps ?Plank on Knees - 1 x daily - 7 x weekly - 3 sets - 10 reps ?Modified Side Plank with Hip Abduction - 1 x daily - 7 x weekly - 2 sets - 10 reps ?Supine Bridge - 1 x daily - 7 x weekly - 3 sets - 10 reps ?Hip Flexor Stretch at Edge of Bed - 1 x daily - 7 x weekly - 2 sets - 1-min hold ?  ?  ?ASSESSMENT: ?  ?CLINICAL IMPRESSION: ?Due to the pt needing to leave early today for a car appointment, the treatment session was truncated. Pt responded well to all interventions today, demonstrating good form and no increase in pain with selected exercises. He will continue to  benefit from skilled PT to address his primary impairments and  return to his prior level of function with less limitation. ? ? ?  ?REHAB POTENTIAL: Excellent ?  ?CLINICAL DECISION MAKING: Stable/uncomplicated ?  ?EVALUATION COMPLEXITY: Low ?  ?  ?GOALS: ?Goals reviewed with patient? Yes ?  ?SHORT TERM GOALS: ?  ?STG Name Target Date Goal status  ?1 Pt will report understanding and adherence to his HEP in order to promote independence in the management of his primary impairments. ?Baseline: HEP provided at eval ?05/06/2021: Pt reports daily HEP adherence. 05/09/2021 ACHIEVED  ?  ?LONG TERM GOALS:  ?  ?LTG Name Target Date Goal status  ?1 Pt will achieve a FOTO score of 66% in order to demonstrate improved functional ability as it relates to his lumbar pain. ?Baseline: 53% 06/06/2021 INITIAL  ?2 Pt will achieve BIL global hip strength of 4+/5 or greater in order to promote participation in basketball with less limitation. ?Baseline: See MMT chart 06/06/2021 INITIAL  ?3 Pt will report no sleep disturbances due to pain for 1 week in order to be well-rested for work. ?Baseline: Regular sleep disturbances >2-3x per week 06/06/2021 Progressing  ?4 Pt will achieve 100% lumbar flexion AROM with 0-2/10 pain in order to put on his shoes with less limitation. ?Baseline:75% with 4/10 pain 06/06/2021 INITIAL  ?5 Pt will report ability to sit >1 hour with 0-2/10 pain in order to sit down to watch TV with less limitation. ?Baseline: >4/10 pain after 30 minutes of sitting 06/06/2021 INITIAL  ?  ?PLAN: ?PT FREQUENCY: 1x/week ?  ?PT DURATION: 8 weeks ?  ?PLANNED INTERVENTIONS: Therapeutic exercises, Therapeutic activity, Neuro Muscular re-education, Patient/Family education, Joint mobilization, Dry Needling, Spinal mobilization, Cryotherapy, Moist heat, Taping, Traction, and Manual therapy ?  ?PLAN FOR NEXT SESSION: Progress core/ hip strengthening with trunk extension bias ? ? ?Vanessa [place], PT, DPT ?05/06/21 2:30 PM ? ?  ? ?

## 2021-05-13 ENCOUNTER — Ambulatory Visit: Payer: BC Managed Care – PPO

## 2021-05-13 ENCOUNTER — Other Ambulatory Visit: Payer: Self-pay

## 2021-05-13 DIAGNOSIS — M6281 Muscle weakness (generalized): Secondary | ICD-10-CM

## 2021-05-13 DIAGNOSIS — M5442 Lumbago with sciatica, left side: Secondary | ICD-10-CM | POA: Diagnosis not present

## 2021-05-13 DIAGNOSIS — G8929 Other chronic pain: Secondary | ICD-10-CM

## 2021-05-13 NOTE — Therapy (Signed)
?OUTPATIENT PHYSICAL THERAPY TREATMENT NOTE/ DISCHARGE SUMMARY ? ? ?Patient Name: Jeremy Good ?MRN: [id number] ?DOB:[birth year], 56 y.o., male ?Today's Date: 05/13/2021 ? ?PCP: Gerrit Heck, MD ?REFERRING PROVIDER: Lind Covert, ? ? PT End of Session - 05/13/21 1540   ? ? Visit Number 5   ? Number of Visits 9   ? Date for PT Re-Evaluation 06/06/21   ? Authorization Type BCBS   ? Authorization Time Period FOTO v6, v10   ? PT Start Time 6269   Pt arrived 10 minutes late to his appointment.  ? PT Stop Time 1615   ? PT Time Calculation (min) 32 min   ? Activity Tolerance Patient tolerated treatment well   ? Behavior During Therapy [facility] for tasks assessed/performed   ? ?  ?  ? ?  ? ? ? ? ? ?History reviewed. No pertinent past medical history. ?Past Surgical History:  ?Procedure Laterality Date  ? left knee artrhoscopy  Left 1981  ? ?Patient Active Problem List  ? Diagnosis Date Noted  ? Acute left-sided low back pain with left-sided sciatica 03/20/2021  ? Abnormal sensation in right ear 03/20/2021  ? Elevated blood pressure reading 03/20/2021  ? Chest discomfort 05/28/2020  ? Post-nasal drip 05/28/2020  ? Screening for diabetes mellitus 04/15/2020  ? Screening for colon cancer 04/15/2020  ? Screening for hyperlipidemia 04/15/2020  ? Throat discomfort 04/15/2020  ? 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 ? ?REFERRING DIAG: M54.42 (ICD-10-CM) - Acute left-sided low back pain with left-sided sciatica ? ?THERAPY DIAG:  ?Chronic left-sided low back pain with left-sided sciatica ? ?Muscle weakness (generalized) ? ? ?SUBJECTIVE: Pt denies any pain today and adds that he has been doing his HEP regularly. Pt reports that he feels ready to be discharged and independently pursue his HEP. ? ?PAIN:  ?Are you having pain? Yes ?NPRS scale: 0/10 ?Pain location: low back ? ? ? ?OBJECTIVE:  ?  ?DIAGNOSTIC FINDINGS:  ?01/04/2020: DG Lumbar Spine Complete: IMPRESSION: ?No acute osseous abnormality. ?  ?PATIENT SURVEYS:  ?FOTO 53%,  projected 66% in 10 visits ?  ?SCREENING FOR RED FLAGS: ?Bowel or bladder incontinence: No ?Cauda equina syndrome: No ?  ?COGNITION: ?         Overall cognitive status: Within functional limits for tasks assessed              ?          ?SENSATION: ?         Light touch: Deficits 25% diminished sensation on Lt vs Rt from L2-S2 ?  ?MUSCLE LENGTH: ?Thomas test: Severe limitation BIL ?  ?POSTURE:  ?Decreased lumbar lordosis in standing/ sitting ?  ?PALPATION: ?TTP to Lt lumbar paraspinals/ QL ?  ?PASSIVE ACCESSORIES: ?Painful and hypomobile, along with reproduction of Lt LE pain/ tingling with CPAs to L2-L4 ?  ?LUMBAR AROM ?  ?AROM AROM  ?04/11/2021 AROM ?05/13/2021  ?Flexion 75% with pain WNL  ?Extension WNL, "feels good" WNL  ?Right lateral flexion Fingertips to knee, tightness WNL  ?Left lateral flexion Fingertips to knee, tightness WNL  ?Right rotation WNL WNL  ?Left rotation WNL WNL  ? (Blank rows = not tested) ?  ?  ?LE MMT: ?  ?MMT Right ?04/11/2021 Left ?04/11/2021 Right ?05/13/2021 Left ?05/13/2021  ?Hip flexion 5/5 5/5 5/5 5/5  ?Hip extension 3+/5 3+/5 5/5 5/5  ?Hip abduction 3+/5 3/5 5/5 5/5  ? (Blank rows = not tested) ?  ?LUMBAR SPECIAL TESTS:  ?Slump: (+) on Lt ?SLR: (+)  on Lt ?Repeated trunk extension x20: (+) for centralization ?  ?  ?FUNCTIONAL TESTS:  ?Plank test: 70 seconds ?Squat: WNL ?  ?  ?  ?TODAY'S TREATMENT  ? ?[facility] Adult PT Treatment:                                                DATE: May 28, 2021 ?Therapeutic Exercise: ?Supine dead bugs 3x20 ?Prone swimmers 3x20 ?Side plank with hip abduction 2x8 BIL ?Sidelying lumbar open books 2x10 BIL ?Hooklying curl-up with diagonal reach 3x12 ?Manual Therapy: ?N/A ?Neuromuscular re-ed: ?N/A ?Therapeutic Activity: ?Education about objective measures and progress made in PT ?Modalities: ?N/A ?Self Care: ?N/A ? ? ?[place] Adult PT Treatment:                                                DATE: 2021-05-21 ?Therapeutic Exercise: ?Dead bugs with YTB around feet 3x20 ?Bird  dogs knee taps 3x10 BIL ?Sidelying lumbar open books 2x10 BIL ?Prone with hips on Bosu ball and PT holding down legs; trunk extension 2x10 ?Sidelying with hips on Bosu ball and PT holding down legs; side trunk bend 2x10 BIL ?Plank with alternating hip extension 3x10 ?Manual Therapy: ?N/A ?Neuromuscular re-ed: ?N/A ?Therapeutic Activity: ?N/A ?Modalities: ?N/A ?Self Care: ?N/A ? ? ?[place] Adult PT Treatment:                                                DATE: 2021/05/07 ?Therapeutic Exercise: ?Dead bugs 3x20 ?Bird dogs 1 x 10, x 10 with dowel on back  ?Dead lift with 95# barbell 3x8 ?Standing cybex hip abduction 2x10 BIL with 32.5# ?Standing cybex hip extension 2x10 BIL with 32.5# ?Pallof press with 10# cable 2x10 with 5-sec holds ?Standing repeated trunk extension 2x20 ? ?  ?  ?  ?PATIENT EDUCATION:  ?Education details: Updated HEP ?Person educated: Patient ?Education method: Explanation, Demonstration, and Handouts ?Education comprehension: verbalized understanding and returned demonstration ?  ?  ?HOME EXERCISE PROGRAM: ?Access Code: [email or web address] ?URL: [email or web address] ?Date: 04/11/2021 ?Prepared by: Vanessa [place] ?  ?Exercises ?Prone Press Up - 1 x daily - 7 x weekly - 3 sets - 30-sec hold ?Standing Lumbar Extension - 1 x daily - 7 x weekly - 3 sets - 10 reps ?Plank on Knees - 1 x daily - 7 x weekly - 3 sets - 10 reps ?Modified Side Plank with Hip Abduction - 1 x daily - 7 x weekly - 2 sets - 10 reps ?Supine Bridge - 1 x daily - 7 x weekly - 3 sets - 10 reps ?Hip Flexor Stretch at Edge of Bed - 1 x daily - 7 x weekly - 2 sets - 1-min hold ? ?Added 28-May-2021: ?Supine Dead Bug with Leg Extension - 1 x daily - 7 x weekly - 3 sets - 20 reps ?Prone Swimmer - 1 x daily - 7 x weekly - 3 sets - 20 reps ?  ?  ?ASSESSMENT: ?  ?CLINICAL IMPRESSION: ?Upon re-assessment, the pt has met all of his functional goals of therapy, making excellent improvement in global hip strength, lumbar AROM, pain  levels, and  FOTO score. He is agreeable to discharge from PT at this time. The pt's HEP was updated, and he will benefit from continued independence maintenance of his LBP. ? ? ?  ?REHAB POTENTIAL: Excellent ?  ?CLINICAL DECISION MAKING: Stable/uncomplicated ?  ?EVALUATION COMPLEXITY: Low ?  ?  ?GOALS: ?Goals reviewed with patient? Yes ?  ?SHORT TERM GOALS: ?  ?STG Name Target Date Goal status  ?1 Pt will report understanding and adherence to his HEP in order to promote independence in the management of his primary impairments. ?Baseline: HEP provided at eval ?05/06/2021: Pt reports daily HEP adherence. 05/09/2021 ACHIEVED  ?  ?LONG TERM GOALS:  ?  ?LTG Name Target Date Goal status  ?1 Pt will achieve a FOTO score of 66% in order to demonstrate improved functional ability as it relates to his lumbar pain. ?Baseline: 53% ?05/13/2021: 94% 06/06/2021 ACHIEVED  ?2 Pt will achieve BIL global hip strength of 4+/5 or greater in order to promote participation in basketball with less limitation. ?Baseline: See MMT chart ?05/13/2021: 5/5 globally 06/06/2021 ACHIEVED  ?3 Pt will report no sleep disturbances due to pain for 1 week in order to be well-rested for work. ?Baseline: Regular sleep disturbances >2-3x per week ?05/13/2021: No sleep disturbances due to pain 06/06/2021 ACHIEVED  ?4 Pt will achieve 100% lumbar flexion AROM with 0-2/10 pain in order to put on his shoes with less limitation. ?Baseline:75% with 4/10 pain ?05/13/2021: ACHIEVED 06/06/2021 ACHIEVED  ?5 Pt will report ability to sit >1 hour with 0-2/10 pain in order to sit down to watch TV with less limitation. ?Baseline: >4/10 pain after 30 minutes of sitting ?05/13/2021: able to sit 2 hours with 0/10 pain, only stiffness 06/06/2021 ACHIEVED  ?  ?PLAN: ?PT FREQUENCY: 1x/week ?  ?PT DURATION: 8 weeks ?  ?PLANNED INTERVENTIONS: Therapeutic exercises, Therapeutic activity, Neuro Muscular re-education, Patient/Family education, Joint mobilization, Dry Needling, Spinal mobilization, Cryotherapy,  Moist heat, Taping, Traction, and Manual therapy ?  ?PLAN FOR NEXT SESSION: Pt is discharged from PT at this time ? ?PHYSICAL THERAPY DISCHARGE SUMMARY ? ?Visits from Start of Care: 5 ? ?Current functi
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
